# Patient Record
Sex: Male | Born: 1980 | Race: Black or African American | Hispanic: No | Marital: Single | Smoking: Light tobacco smoker
Health system: Southern US, Community
[De-identification: ages and names within clinical notes are randomized; demographics above are authoritative.]

## PROBLEM LIST (undated history)

## (undated) DIAGNOSIS — F1910 Other psychoactive substance abuse, uncomplicated: Secondary | ICD-10-CM

---

## 2019-12-27 ENCOUNTER — Emergency Department (HOSPITAL_COMMUNITY): Payer: Self-pay

## 2019-12-27 ENCOUNTER — Other Ambulatory Visit: Payer: Self-pay

## 2019-12-27 ENCOUNTER — Encounter (HOSPITAL_COMMUNITY): Payer: Self-pay | Admitting: Emergency Medicine

## 2019-12-27 ENCOUNTER — Emergency Department (HOSPITAL_COMMUNITY)
Admission: EM | Admit: 2019-12-27 | Discharge: 2019-12-27 | Disposition: A | Discharge status: Home / Self Care | Payer: Self-pay | Attending: Emergency Medicine | Admitting: Emergency Medicine

## 2019-12-27 DIAGNOSIS — T40601A Poisoning by unspecified narcotics, accidental (unintentional), initial encounter: Secondary | ICD-10-CM

## 2019-12-27 DIAGNOSIS — T402X1A Poisoning by other opioids, accidental (unintentional), initial encounter: Secondary | ICD-10-CM | POA: Insufficient documentation

## 2019-12-27 DIAGNOSIS — Z79899 Other long term (current) drug therapy: Secondary | ICD-10-CM | POA: Insufficient documentation

## 2019-12-27 DIAGNOSIS — Z20822 Contact with and (suspected) exposure to covid-19: Secondary | ICD-10-CM | POA: Insufficient documentation

## 2019-12-27 DIAGNOSIS — R110 Nausea: Secondary | ICD-10-CM | POA: Insufficient documentation

## 2019-12-27 LAB — CBC WITH DIFFERENTIAL/PLATELET
Abs Immature Granulocytes: 0.01 10*3/uL (ref 0.00–0.07)
Basophils Absolute: 0 10*3/uL (ref 0.0–0.1)
Basophils Relative: 0 %
Eosinophils Absolute: 0.2 10*3/uL (ref 0.0–0.5)
Eosinophils Relative: 3 %
HCT: 39.5 % (ref 39.0–52.0)
Hemoglobin: 13 g/dL (ref 13.0–17.0)
Immature Granulocytes: 0 %
Lymphocytes Relative: 33 %
Lymphs Abs: 1.6 10*3/uL (ref 0.7–4.0)
MCH: 29.6 pg (ref 26.0–34.0)
MCHC: 32.9 g/dL (ref 30.0–36.0)
MCV: 90 fL (ref 80.0–100.0)
Monocytes Absolute: 0.5 10*3/uL (ref 0.1–1.0)
Monocytes Relative: 10 %
Neutro Abs: 2.5 10*3/uL (ref 1.7–7.7)
Neutrophils Relative %: 54 %
Platelets: 294 10*3/uL (ref 150–400)
RBC: 4.39 MIL/uL (ref 4.22–5.81)
RDW: 12.8 % (ref 11.5–15.5)
WBC: 4.8 10*3/uL (ref 4.0–10.5)
nRBC: 0 % (ref 0.0–0.2)

## 2019-12-27 LAB — COMPREHENSIVE METABOLIC PANEL
ALT: 174 U/L — ABNORMAL HIGH (ref 0–44)
AST: 158 U/L — ABNORMAL HIGH (ref 15–41)
Albumin: 4.5 g/dL (ref 3.5–5.0)
Alkaline Phosphatase: 55 U/L (ref 38–126)
Anion gap: 9 (ref 5–15)
BUN: 14 mg/dL (ref 6–20)
CO2: 26 mmol/L (ref 22–32)
Calcium: 8.8 mg/dL — ABNORMAL LOW (ref 8.9–10.3)
Chloride: 102 mmol/L (ref 98–111)
Creatinine, Ser: 0.81 mg/dL (ref 0.61–1.24)
GFR calc Af Amer: >60 mL/min (ref >60)
GFR calc non Af Amer: >60 mL/min (ref >60)
Glucose, Bld: 157 mg/dL — ABNORMAL HIGH (ref 70–99)
Potassium: 3.7 mmol/L (ref 3.5–5.1)
Sodium: 137 mmol/L (ref 135–145)
Total Bilirubin: 0.5 mg/dL (ref 0.3–1.2)
Total Protein: 8 g/dL (ref 6.5–8.1)

## 2019-12-27 LAB — SARS CORONAVIRUS 2 BY RT PCR (HOSPITAL ORDER, PERFORMED IN ~~LOC~~ HOSPITAL LAB): SARS Coronavirus 2: NEGATIVE

## 2019-12-27 LAB — ACETAMINOPHEN LEVEL: Acetaminophen (Tylenol), Serum: <10 ug/mL — ABNORMAL LOW (ref 10–30)

## 2019-12-27 LAB — ETHANOL: Alcohol, Ethyl (B): <10 mg/dL (ref <10)

## 2019-12-27 LAB — CBG MONITORING, ED: Glucose-Capillary: 158 mg/dL — ABNORMAL HIGH (ref 70–99)

## 2019-12-27 LAB — SALICYLATE LEVEL: Salicylate Lvl: <7 mg/dL — ABNORMAL LOW (ref 7.0–30.0)

## 2019-12-27 MED ORDER — SODIUM CHLORIDE 0.9 % IV BOLUS
1000.0000 mL | Freq: Once | INTRAVENOUS | Status: AC
  Administered 2019-12-27: 1000 mL via INTRAVENOUS

## 2019-12-27 MED ORDER — SODIUM CHLORIDE 0.9 % IV SOLN: INTRAVENOUS | Status: DC

## 2019-12-27 MED ORDER — ONDANSETRON HCL 4 MG/2ML IJ SOLN
4.0000 mg | Freq: Once | INTRAMUSCULAR | Status: AC
  Administered 2019-12-27: 4 mg via INTRAVENOUS
  Filled 2019-12-27: qty 2

## 2019-12-27 MED ORDER — ONDANSETRON HCL 4 MG/2ML IJ SOLN: 4.0000 mg | Freq: Once | INTRAMUSCULAR | Status: DC

## 2019-12-27 NOTE — ED Provider Notes (Signed)
[facility]-EMERGENCY DEPT Provider Note   CSN: [id number] Arrival date & time: 12/27/19  1427     History Chief Complaint  Patient presents with  . Drug Overdose    Leib Kerth is a 39 y.o. male.  Pt presents to the ED after an overdose of opiates.  Pt said he snorted (for the first time) today.  He remembers seeing a woman and talking to her and telling her that he felt weird.  The next thing he knew, he was staring up at EMS.  Per EMS, pt had fallen onto his face and was unresponsive.  They gave him a total of 3 mg of narcan IN.  Now, he is awake and alert.  He feels a little nauseous.  He has some abrasions to his face, but he does not hurt.        History reviewed. No pertinent past medical history.  There are no problems to display for this patient.   History reviewed. No pertinent surgical history.     No family history on file.  Social History   Tobacco Use  . Smoking status: Not on file  Substance Use Topics  . Alcohol use: Not on file  . Drug use: Not on file    Home Medications Prior to Admission medications   Medication Sig Start Date End Date Taking? Authorizing Provider  Multiple Vitamin (MULTIVITAMIN ADULT) TABS Take 1 tablet by mouth daily.   Yes [provider]    Allergies    Patient has no known allergies.  Review of Systems   Review of Systems  Gastrointestinal: Positive for nausea.  All other systems reviewed and are negative.   Physical Exam Updated Vital Signs BP 119/80   Pulse (!) 56   Resp 14   SpO2 97%   Physical Exam Vitals and nursing note reviewed.  Constitutional:      Appearance: Normal appearance.  HENT:     Head: Normocephalic.     Comments: Small abrasions to forehead    Right Ear: External ear normal.     Left Ear: External ear normal.     Nose: Nose normal.     Mouth/Throat:     Mouth: Mucous membranes are dry.  Eyes:     Extraocular Movements: Extraocular movements  intact.     Conjunctiva/sclera: Conjunctivae normal.     Pupils: Pupils are equal, round, and reactive to light.  Cardiovascular:     Rate and Rhythm: Normal rate and regular rhythm.     Pulses: Normal pulses.     Heart sounds: Normal heart sounds.  Pulmonary:     Effort: Pulmonary effort is normal.     Breath sounds: Normal breath sounds.  Abdominal:     General: Abdomen is flat. Bowel sounds are normal.     Palpations: Abdomen is soft.  Musculoskeletal:        General: Normal range of motion.     Cervical back: Normal range of motion and neck supple.  Skin:    General: Skin is warm.     Capillary Refill: Capillary refill takes less than 2 seconds.  Neurological:     General: No focal deficit present.     Mental Status: He is alert and oriented to person, place, and time.  Psychiatric:        Mood and Affect: Mood normal.        Behavior: Behavior normal.        Thought Content: Thought content normal.  Judgment: Judgment normal.     ED Results / Procedures / Treatments   Labs (all labs ordered are listed, but only abnormal results are displayed) Labs Reviewed  CBG MONITORING, ED - Abnormal; Notable for the following components:      Result Value   Glucose-Capillary 158 (*)    All other components within normal limits  SARS CORONAVIRUS 2 BY RT PCR (HOSPITAL ORDER, PERFORMED IN  HOSPITAL LAB)  CBC WITH DIFFERENTIAL/PLATELET  COMPREHENSIVE METABOLIC PANEL  SALICYLATE LEVEL  ACETAMINOPHEN LEVEL  ETHANOL  RAPID URINE DRUG SCREEN, HOSP PERFORMED  URINALYSIS, ROUTINE W REFLEX MICROSCOPIC    EKG EKG Interpretation  Date/Time:  Wednesday December 27 2019 15:08:52 EDT Ventricular Rate:  58 PR Interval:    QRS Duration: 101 QT Interval:  496 QTC Calculation: 488 R Axis:   30 Text Interpretation: Sinus rhythm Abnormal R-wave progression, early transition Borderline prolonged QT interval No old tracing to compare Confirmed by Jacalyn Lefevre [phone]) on  12/27/2019 3:57:52 PM   Radiology DG Chest Portable 1 View  Result Date: 12/27/2019 CLINICAL DATA:  Unresponsive. EXAM: PORTABLE CHEST 1 VIEW COMPARISON:  None. FINDINGS: There is no evidence of acute infiltrate, pleural effusion or pneumothorax. The heart size and mediastinal contours are within normal limits. The visualized skeletal structures are unremarkable. IMPRESSION: No active disease. Electronically Signed   By: Aram Candela M.D.   On: 12/27/2019 15:21    Procedures Procedures (including critical care time)  Medications Ordered in ED Medications  sodium chloride 0.9 % bolus 1,000 mL (1,000 mLs Intravenous New Bag/Given 12/27/19 1550)    And  0.9 %  sodium chloride infusion (has no administration in time range)  ondansetron (ZOFRAN) injection 4 mg (4 mg Intravenous Given 12/27/19 1551)    ED Course  I have reviewed the triage vital signs and the nursing notes.  Pertinent labs & imaging results that were available during my care of the patient were reviewed by me and considered in my medical decision making (see chart for details).    MDM Rules/Calculators/A&P                          Pt is doing well.  Labs are pending at shift change.  Pt signed out to Dr. Deretha Emory at shift change.  Pt will need to be observed for a little longer.  If he is doing well and labs ok, he can go home.   Final Clinical Impression(s) / ED Diagnoses Final diagnoses:  Opiate overdose, accidental or unintentional, initial encounter [facility])    Rx / DC Orders ED Discharge Orders    None       Jacalyn Lefevre, MD 12/27/19 1646

## 2019-12-27 NOTE — ED Notes (Signed)
Patient ambulatory to the lobby with a steady gait and belongings. 

## 2019-12-27 NOTE — ED Triage Notes (Signed)
[place] [facility] [place]-OD on [place]. Got Narcan 3mg  intranasally at scene.  Vitals: 111/77, CBG 227, 98% on ra after Narcan given.

## 2019-12-27 NOTE — ED Provider Notes (Addendum)
Patient turned over to me following opiate overdose.  No suicidal ideation.  Patient's labs I significant abnormalities.  Patient has had some food and liquids to drink.  But is now having some difficulty with nausea.  We will give him Zofran.  His EKG just had borderline prolonged QT.  So I think it will be safe.  He has the IV in place and the more monitor to him.  He does okay will be discharged home.   Vanetta Mulders, MD 12/27/19 2012  Patient feeling better after the Zofran.  Cardiac monitoring shows notes prolonged or extension of the QT interval.  Patient stable for discharge home.   Vanetta Mulders, MD 12/27/19 2059

## 2019-12-27 NOTE — ED Notes (Signed)
Assumed care of patient at this time, nad noted, sr up x2, bed locked and low, call bell w/I reach.  Will continue to monitor.  sandwich and juice has been provided.

## 2019-12-27 NOTE — Discharge Instructions (Addendum)
Return for any new or worse symptoms.  Information provided on substance abuse.

## 2020-06-25 ENCOUNTER — Emergency Department (HOSPITAL_COMMUNITY)
Admission: EM | Admit: 2020-06-25 | Discharge: 2020-06-25 | Disposition: A | Discharge status: Home / Self Care | Payer: Self-pay | Attending: Emergency Medicine | Admitting: Emergency Medicine

## 2020-06-25 ENCOUNTER — Emergency Department (HOSPITAL_COMMUNITY): Payer: Self-pay

## 2020-06-25 ENCOUNTER — Other Ambulatory Visit: Payer: Self-pay

## 2020-06-25 DIAGNOSIS — R0789 Other chest pain: Secondary | ICD-10-CM

## 2020-06-25 DIAGNOSIS — R1310 Dysphagia, unspecified: Secondary | ICD-10-CM | POA: Insufficient documentation

## 2020-06-25 MED ORDER — ALUM & MAG HYDROXIDE-SIMETH 200-200-20 MG/5ML PO SUSP
30.0000 mL | Freq: Once | ORAL | Status: AC
  Administered 2020-06-25: 30 mL via ORAL
  Filled 2020-06-25: qty 30

## 2020-06-25 MED ORDER — LIDOCAINE VISCOUS HCL 2 % MT SOLN
15.0000 mL | Freq: Once | OROMUCOSAL | Status: AC
  Administered 2020-06-25: 15 mL via ORAL
  Filled 2020-06-25: qty 15
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
## 2020-06-25 NOTE — Discharge Instructions (Signed)
You were seen today for some chest discomfort.  This related to chest wall pain.  Your x-ray and EKG are reassuring.  Take ibuprofen as needed for pain.

## 2020-06-25 NOTE — ED Notes (Signed)
Pt taken to scan at this time. 

## 2020-06-26 ENCOUNTER — Other Ambulatory Visit: Payer: Self-pay

## 2020-06-26 ENCOUNTER — Encounter (HOSPITAL_COMMUNITY): Payer: Self-pay

## 2020-06-26 ENCOUNTER — Emergency Department (HOSPITAL_COMMUNITY)
Admission: EM | Admit: 2020-06-26 | Discharge: 2020-06-26 | Disposition: A | Discharge status: Home / Self Care | Payer: Self-pay | Attending: Emergency Medicine | Admitting: Emergency Medicine

## 2020-06-26 DIAGNOSIS — R432 Parageusia: Secondary | ICD-10-CM

## 2020-06-26 DIAGNOSIS — R439 Unspecified disturbances of smell and taste: Secondary | ICD-10-CM | POA: Insufficient documentation

## 2020-06-26 LAB — COMPREHENSIVE METABOLIC PANEL
ALT: 77 U/L — ABNORMAL HIGH (ref 0–44)
AST: 72 U/L — ABNORMAL HIGH (ref 15–41)
Albumin: 4.2 g/dL (ref 3.5–5.0)
Alkaline Phosphatase: 57 U/L (ref 38–126)
Anion gap: 8 (ref 5–15)
BUN: 14 mg/dL (ref 6–20)
CO2: 28 mmol/L (ref 22–32)
Calcium: 9.2 mg/dL (ref 8.9–10.3)
Chloride: 99 mmol/L (ref 98–111)
Creatinine, Ser: 0.88 mg/dL (ref 0.61–1.24)
GFR, Estimated: >60 mL/min (ref >60)
Glucose, Bld: 96 mg/dL (ref 70–99)
Potassium: 4.2 mmol/L (ref 3.5–5.1)
Sodium: 135 mmol/L (ref 135–145)
Total Bilirubin: 0.8 mg/dL (ref 0.3–1.2)
Total Protein: 7.6 g/dL (ref 6.5–8.1)

## 2020-06-26 LAB — CBC
HCT: 37.4 % — ABNORMAL LOW (ref 39.0–52.0)
Hemoglobin: 12.3 g/dL — ABNORMAL LOW (ref 13.0–17.0)
MCH: 29.1 pg (ref 26.0–34.0)
MCHC: 32.9 g/dL (ref 30.0–36.0)
MCV: 88.4 fL (ref 80.0–100.0)
Platelets: 385 10*3/uL (ref 150–400)
RBC: 4.23 MIL/uL (ref 4.22–5.81)
RDW: 11.8 % (ref 11.5–15.5)
WBC: 5 10*3/uL (ref 4.0–10.5)
nRBC: 0 % (ref 0.0–0.2)

## 2020-06-26 MED ORDER — LIDOCAINE VISCOUS HCL 2 % MT SOLN
15.0000 mL | Freq: Once | OROMUCOSAL | Status: AC
  Administered 2020-06-26: 15 mL via ORAL
  Filled 2020-06-26: qty 15

## 2020-06-26 MED ORDER — ALUM & MAG HYDROXIDE-SIMETH 200-200-20 MG/5ML PO SUSP
30.0000 mL | Freq: Once | ORAL | Status: AC
  Administered 2020-06-26: 30 mL via ORAL
  Filled 2020-06-26: qty 30

## 2020-06-26 MED ORDER — ALUM & MAG HYDROXIDE-SIMETH 400-400-40 MG/5ML PO SUSP: 5.0000 mL | ORAL | 0 refills | Status: DC | PRN

## 2020-06-26 MED ORDER — FAMOTIDINE 20 MG PO TABS
20.0000 mg | ORAL_TABLET | Freq: Once | ORAL | Status: AC
  Administered 2020-06-26: 20 mg via ORAL
  Filled 2020-06-26: qty 1

## 2020-06-26 MED ORDER — FAMOTIDINE 20 MG PO TABS: 20.0000 mg | ORAL_TABLET | Freq: Every day | ORAL | 0 refills | Status: DC

## 2020-06-26 NOTE — Discharge Instructions (Addendum)
Take pepcid daily to decrease stomach acid.  Use maalox as needed for breakthrough symptoms.  Follow up with the stomach doctor listed below as needed for further evaluation.  Return to there ER with any new, worsening, or concerning symptoms.
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
## 2020-06-26 NOTE — ED Triage Notes (Signed)
Pt BIB [facility] from home c/o "tasting blood in his mouth". Thinks it's coming from somewhere lower, no visible blood. No visible trauma. VSS. Pt reports anxiety, seen at [facility] yesterday for 'unspecified chest pain', prescribed tylenol.  BP 134 HR 80 SpO2 98% RA

## 2020-06-29 ENCOUNTER — Other Ambulatory Visit: Payer: Self-pay

## 2020-06-29 ENCOUNTER — Emergency Department (HOSPITAL_COMMUNITY)
Admission: EM | Admit: 2020-06-29 | Discharge: 2020-06-29 | Disposition: A | Discharge status: Home / Self Care | Payer: Self-pay | Attending: Emergency Medicine | Admitting: Emergency Medicine

## 2020-06-29 DIAGNOSIS — F1910 Other psychoactive substance abuse, uncomplicated: Secondary | ICD-10-CM | POA: Insufficient documentation

## 2020-06-29 DIAGNOSIS — F1010 Alcohol abuse, uncomplicated: Secondary | ICD-10-CM | POA: Insufficient documentation

## 2020-06-29 DIAGNOSIS — F419 Anxiety disorder, unspecified: Secondary | ICD-10-CM | POA: Insufficient documentation

## 2020-06-29 NOTE — ED Notes (Signed)
Agree with triage note. Vitals taken. A&Ox4. Respirations regular/unlabored. Pt able to speak full sentences. Connected to cardiac monitor, BP, pulse ox. Stretcher low, wheels locked, call bell within reach.

## 2020-06-29 NOTE — ED Notes (Signed)
Pt given discharge paper work. Vitals taken. Pt changing into clothes for discharge.

## 2020-06-29 NOTE — ED Provider Notes (Signed)
[facility]-EMERGENCY DEPT Provider Note   CSN: [id number] Arrival date & time: 06/29/20  0235     History Chief Complaint  Patient presents with  . Drug Problem  . Anxiety    Robert Duffy is a 40 y.o. male.  Patient presents to the emergency department with concerns over feeling very anxious after doing multiple drugs and drinking alcohol.  He is not homicidal or suicidal.        No past medical history on file.  There are no problems to display for this patient.   No past surgical history on file.     No family history on file.     Home Medications Prior to Admission medications   Medication Sig Start Date End Date Taking? Authorizing Provider  alum & mag hydroxide-simeth (MAALOX ADVANCED MAX ST) 400-400-40 MG/5ML suspension Take 5 mLs by mouth as needed for indigestion. 06/26/20   Caccavale, Sophia, PA-C  famotidine (PEPCID) 20 MG tablet Take 1 tablet (20 mg total) by mouth daily. 06/26/20   Caccavale, Sophia, PA-C  Multiple Vitamin (MULTIVITAMIN ADULT) TABS Take 1 tablet by mouth daily.    [provider]    Allergies    Patient has no known allergies.  Review of Systems   Review of Systems  Psychiatric/Behavioral: The patient is nervous/anxious.   All other systems reviewed and are negative.   Physical Exam Updated Vital Signs BP (!) 150/96   Pulse 70   Temp 98.1 F (36.7 C) (Oral)   Resp 13   Ht 5\' 11"  (1.803 m)   Wt 83.9 kg   SpO2 96%   BMI 25.80 kg/m   Physical Exam Vitals and nursing note reviewed.  Constitutional:      General: He is not in acute distress.    Appearance: Normal appearance. He is well-developed and well-nourished.  HENT:     Head: Normocephalic and atraumatic.     Right Ear: Hearing normal.     Left Ear: Hearing normal.     Nose: Nose normal.     Mouth/Throat:     Mouth: Oropharynx is clear and moist and mucous membranes are normal.  Eyes:     Extraocular Movements: EOM normal.      Conjunctiva/sclera: Conjunctivae normal.     Pupils: Pupils are equal, round, and reactive to light.  Cardiovascular:     Rate and Rhythm: Regular rhythm.     Heart sounds: S1 normal and S2 normal. No murmur heard. No friction rub. No gallop.   Pulmonary:     Effort: Pulmonary effort is normal. No respiratory distress.     Breath sounds: Normal breath sounds.  Chest:     Chest wall: No tenderness.  Abdominal:     General: Bowel sounds are normal.     Palpations: Abdomen is soft. There is no hepatosplenomegaly.     Tenderness: There is no abdominal tenderness. There is no guarding or rebound. Negative signs include Murphy's sign and McBurney's sign.     Hernia: No hernia is present.  Musculoskeletal:        General: Normal range of motion.     Cervical back: Normal range of motion and neck supple.  Skin:    General: Skin is warm, dry and intact.     Findings: No rash.     Nails: There is no cyanosis.  Neurological:     Mental Status: He is alert and oriented to person, place, and time.     GCS: GCS  eye subscore is 4. GCS verbal subscore is 5. GCS motor subscore is 6.     Cranial Nerves: No cranial nerve deficit.     Sensory: No sensory deficit.     Coordination: Coordination normal.     Deep Tendon Reflexes: Strength normal.  Psychiatric:        Mood and Affect: Mood and affect normal.        Speech: Speech normal.        Behavior: Behavior normal.        Thought Content: Thought content normal.     ED Results / Procedures / Treatments   Labs (all labs ordered are listed, but only abnormal results are displayed) Labs Reviewed - No data to display  EKG None  Radiology No results found.  Procedures Procedures   Medications Ordered in ED Medications - No data to display  ED Course  I have reviewed the triage vital signs and the nursing notes.  Pertinent labs & imaging results that were available during my care of the patient were reviewed by me and considered in  my medical decision making (see chart for details).    MDM Rules/Calculators/A&P                          Patient comes to the emergency department for evaluation of anxiety.  Patient has been drinking alcohol, using marijuana and ecstasy tonight.  Patient's anxiety is secondary to substance use disorder.  He is relatively calm at this point with essentially normal vital signs.  He is not homicidal or suicidal.  Has been monitored through the night, discharged with substance abuse resources.  Final Clinical Impression(s) / ED Diagnoses Final diagnoses:  Anxiety  Polysubstance abuse [facility])    Rx / DC Orders ED Discharge Orders    None       Taviana Westergren, Canary Brim, MD 06/29/20 [phone]
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
## 2020-07-18 ENCOUNTER — Emergency Department (HOSPITAL_COMMUNITY)
Admission: EM | Admit: 2020-07-18 | Discharge: 2020-07-18 | Disposition: A | Discharge status: Home / Self Care | Payer: Self-pay | Attending: Emergency Medicine | Admitting: Emergency Medicine

## 2020-07-18 ENCOUNTER — Emergency Department (HOSPITAL_COMMUNITY): Payer: Self-pay

## 2020-07-18 ENCOUNTER — Other Ambulatory Visit: Payer: Self-pay

## 2020-07-18 ENCOUNTER — Encounter (HOSPITAL_COMMUNITY): Payer: Self-pay | Admitting: Emergency Medicine

## 2020-07-18 DIAGNOSIS — R0602 Shortness of breath: Secondary | ICD-10-CM | POA: Insufficient documentation

## 2020-07-18 DIAGNOSIS — R1310 Dysphagia, unspecified: Secondary | ICD-10-CM | POA: Insufficient documentation

## 2020-07-18 MED ORDER — FAMOTIDINE 20 MG PO TABS: 20.0000 mg | ORAL_TABLET | Freq: Two times a day (BID) | ORAL | 0 refills | Status: AC

## 2020-07-18 MED ORDER — FAMOTIDINE 20 MG PO TABS
20.0000 mg | ORAL_TABLET | Freq: Once | ORAL | Status: AC
  Administered 2020-07-18: 20 mg via ORAL
  Filled 2020-07-18: qty 1

## 2020-07-18 MED ORDER — LIDOCAINE VISCOUS HCL 2 % MT SOLN
15.0000 mL | Freq: Once | OROMUCOSAL | Status: AC
  Administered 2020-07-18: 15 mL via ORAL
  Filled 2020-07-18: qty 15

## 2020-07-18 MED ORDER — ALUM & MAG HYDROXIDE-SIMETH 200-200-20 MG/5ML PO SUSP
30.0000 mL | Freq: Once | ORAL | Status: AC
  Administered 2020-07-18: 30 mL via ORAL
  Filled 2020-07-18: qty 30
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
## 2020-07-18 NOTE — Discharge Instructions (Signed)
Please read and follow all provided instructions.  Your diagnoses today include:  1. Shortness of breath     Tests performed today include:  Chest x-ray - no problems seen  Vital signs. See below for your results today.   Medications prescribed:   Pepcid (famotidine) - antihistamine  You can find this medication over-the-counter.   DO NOT exceed:   20mg  Pepcid every 12 hours  Take any prescribed medications only as directed.  Home care instructions:  Follow any educational materials contained in this packet.  BE VERY CAREFUL not to take multiple medicines containing Tylenol (also called acetaminophen). Doing so can lead to an overdose which can damage your liver and cause liver failure and possibly death.   Follow-up instructions: Please follow-up with your primary care provider in the next 7 days for further evaluation of your symptoms.   Return instructions:   Please return to the Emergency Department if you experience worsening symptoms.   Please return if you have any other emergent concerns.  Additional Information:  Your vital signs today were: BP 117/64 (BP Location: Left Arm)   Pulse 74   Temp (!) 97.4 F (36.3 C) (Oral)   Resp 18   SpO2 96%  If your blood pressure (BP) was elevated above 135/85 this visit, please have this repeated by your doctor within one month. --------------
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
## 2020-07-24 ENCOUNTER — Emergency Department (HOSPITAL_COMMUNITY): Payer: Self-pay

## 2020-07-24 ENCOUNTER — Emergency Department (HOSPITAL_COMMUNITY)
Admission: EM | Admit: 2020-07-24 | Discharge: 2020-07-24 | Disposition: A | Discharge status: Home / Self Care | Payer: Self-pay | Attending: Emergency Medicine | Admitting: Emergency Medicine

## 2020-07-24 ENCOUNTER — Encounter (HOSPITAL_COMMUNITY): Payer: Self-pay

## 2020-07-24 ENCOUNTER — Other Ambulatory Visit: Payer: Self-pay

## 2020-07-24 DIAGNOSIS — R0602 Shortness of breath: Secondary | ICD-10-CM | POA: Insufficient documentation

## 2020-07-24 DIAGNOSIS — J029 Acute pharyngitis, unspecified: Secondary | ICD-10-CM | POA: Insufficient documentation

## 2020-07-24 DIAGNOSIS — R0789 Other chest pain: Secondary | ICD-10-CM | POA: Insufficient documentation

## 2020-07-24 DIAGNOSIS — F17210 Nicotine dependence, cigarettes, uncomplicated: Secondary | ICD-10-CM | POA: Insufficient documentation

## 2020-07-24 MED ORDER — LIDOCAINE VISCOUS HCL 2 % MT SOLN
15.0000 mL | Freq: Once | OROMUCOSAL | Status: AC
  Administered 2020-07-24: 15 mL via ORAL
  Filled 2020-07-24: qty 15

## 2020-07-24 MED ORDER — ALUM & MAG HYDROXIDE-SIMETH 200-200-20 MG/5ML PO SUSP
30.0000 mL | Freq: Once | ORAL | Status: AC
  Administered 2020-07-24: 30 mL via ORAL
  Filled 2020-07-24: qty 30

## 2020-07-24 NOTE — ED Notes (Signed)
Provided pt with sandwich and sprite on discharge.

## 2020-07-24 NOTE — ED Triage Notes (Signed)
Pt BIB [facility] with his esophagus moving while walking. Pt states he fell 2 weeks ago and this problem has occurred since then. No difficulty breathing, SHOB, or chest pain. Pt is ambulatory, A&Ox4.
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
## 2020-07-24 NOTE — Discharge Instructions (Addendum)
You were provided with the x-ray of your chest.  Please fill your medications were prescribed to you on your last visit.  To the [facility] is attached to your chart, please schedule an appointment for further primary care.

## 2020-07-24 NOTE — ED Triage Notes (Signed)
Per EMS-states his "esophagus" moves when he walks

## 2020-07-31 ENCOUNTER — Other Ambulatory Visit: Payer: Self-pay

## 2020-07-31 ENCOUNTER — Emergency Department (HOSPITAL_COMMUNITY)
Admission: EM | Admit: 2020-07-31 | Discharge: 2020-07-31 | Disposition: A | Discharge status: Home / Self Care | Payer: Self-pay | Attending: Emergency Medicine | Admitting: Emergency Medicine

## 2020-07-31 ENCOUNTER — Encounter (HOSPITAL_COMMUNITY): Payer: Self-pay

## 2020-07-31 ENCOUNTER — Emergency Department (HOSPITAL_COMMUNITY): Payer: Self-pay

## 2020-07-31 DIAGNOSIS — R140 Abdominal distension (gaseous): Secondary | ICD-10-CM | POA: Insufficient documentation

## 2020-07-31 DIAGNOSIS — K3189 Other diseases of stomach and duodenum: Secondary | ICD-10-CM

## 2020-07-31 DIAGNOSIS — R0789 Other chest pain: Secondary | ICD-10-CM | POA: Insufficient documentation

## 2020-07-31 DIAGNOSIS — F17210 Nicotine dependence, cigarettes, uncomplicated: Secondary | ICD-10-CM | POA: Insufficient documentation

## 2020-07-31 LAB — TROPONIN I (HIGH SENSITIVITY): Troponin I (High Sensitivity): 2 ng/L (ref <18)

## 2020-07-31 MED ORDER — OMEPRAZOLE 20 MG PO CPDR: 20.0000 mg | DELAYED_RELEASE_CAPSULE | Freq: Every day | ORAL | 0 refills | Status: DC

## 2020-07-31 MED ORDER — ALUM & MAG HYDROXIDE-SIMETH 200-200-20 MG/5ML PO SUSP
30.0000 mL | Freq: Once | ORAL | Status: AC
  Administered 2020-07-31: 30 mL via ORAL
  Filled 2020-07-31: qty 30
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
## 2020-07-31 NOTE — ED Notes (Signed)
Pt to X-Ray via stretcher 

## 2020-07-31 NOTE — Discharge Instructions (Signed)
You were seen today for atypical chest pain.  This is likely related to your stomach.  You may have reflux and should start medications.  Your x-ray shows that your stomach is very distended.  Given that you are not vomiting, this is likely related to the large amount of food that you ate prior to arrival.  Make sure that you are eating small meals.
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
## 2020-08-20 ENCOUNTER — Encounter (HOSPITAL_COMMUNITY): Payer: Self-pay

## 2020-08-20 ENCOUNTER — Other Ambulatory Visit: Payer: Self-pay

## 2020-08-20 ENCOUNTER — Emergency Department (HOSPITAL_COMMUNITY)
Admission: EM | Admit: 2020-08-20 | Discharge: 2020-08-20 | Disposition: A | Discharge status: Home / Self Care | Payer: Self-pay | Attending: Emergency Medicine | Admitting: Emergency Medicine

## 2020-08-20 DIAGNOSIS — R0789 Other chest pain: Secondary | ICD-10-CM | POA: Insufficient documentation

## 2020-08-20 DIAGNOSIS — R42 Dizziness and giddiness: Secondary | ICD-10-CM | POA: Insufficient documentation

## 2020-08-20 DIAGNOSIS — F17210 Nicotine dependence, cigarettes, uncomplicated: Secondary | ICD-10-CM | POA: Insufficient documentation

## 2020-08-20 LAB — COMPREHENSIVE METABOLIC PANEL
ALT: 80 U/L — ABNORMAL HIGH (ref 0–44)
AST: 82 U/L — ABNORMAL HIGH (ref 15–41)
Albumin: 4.1 g/dL (ref 3.5–5.0)
Alkaline Phosphatase: 54 U/L (ref 38–126)
Anion gap: 7 (ref 5–15)
BUN: 16 mg/dL (ref 6–20)
CO2: 27 mmol/L (ref 22–32)
Calcium: 8.7 mg/dL — ABNORMAL LOW (ref 8.9–10.3)
Chloride: 100 mmol/L (ref 98–111)
Creatinine, Ser: 0.89 mg/dL (ref 0.61–1.24)
GFR, Estimated: >60 mL/min (ref >60)
Glucose, Bld: 93 mg/dL (ref 70–99)
Potassium: 3.9 mmol/L (ref 3.5–5.1)
Sodium: 134 mmol/L — ABNORMAL LOW (ref 135–145)
Total Bilirubin: 0.5 mg/dL (ref 0.3–1.2)
Total Protein: 7.4 g/dL (ref 6.5–8.1)

## 2020-08-20 LAB — CBC WITH DIFFERENTIAL/PLATELET
Abs Immature Granulocytes: 0 10*3/uL (ref 0.00–0.07)
Basophils Absolute: 0 10*3/uL (ref 0.0–0.1)
Basophils Relative: 0 %
Eosinophils Absolute: 0.2 10*3/uL (ref 0.0–0.5)
Eosinophils Relative: 5 %
HCT: 37 % — ABNORMAL LOW (ref 39.0–52.0)
Hemoglobin: 12.2 g/dL — ABNORMAL LOW (ref 13.0–17.0)
Immature Granulocytes: 0 %
Lymphocytes Relative: 34 %
Lymphs Abs: 1.6 10*3/uL (ref 0.7–4.0)
MCH: 29 pg (ref 26.0–34.0)
MCHC: 33 g/dL (ref 30.0–36.0)
MCV: 88.1 fL (ref 80.0–100.0)
Monocytes Absolute: 0.4 10*3/uL (ref 0.1–1.0)
Monocytes Relative: 10 %
Neutro Abs: 2.3 10*3/uL (ref 1.7–7.7)
Neutrophils Relative %: 51 %
Platelets: 307 10*3/uL (ref 150–400)
RBC: 4.2 MIL/uL — ABNORMAL LOW (ref 4.22–5.81)
RDW: 12.2 % (ref 11.5–15.5)
WBC: 4.6 10*3/uL (ref 4.0–10.5)
nRBC: 0 % (ref 0.0–0.2)

## 2020-08-20 LAB — LIPASE, BLOOD: Lipase: 25 U/L (ref 11–51)

## 2020-08-20 LAB — ETHANOL: Alcohol, Ethyl (B): <10 mg/dL (ref <10)

## 2020-08-20 LAB — TROPONIN I (HIGH SENSITIVITY): Troponin I (High Sensitivity): 2 ng/L (ref <18)

## 2020-08-20 MED ORDER — LIDOCAINE VISCOUS HCL 2 % MT SOLN
15.0000 mL | Freq: Once | OROMUCOSAL | Status: AC
  Administered 2020-08-20: 15 mL via ORAL
  Filled 2020-08-20: qty 15

## 2020-08-20 MED ORDER — ALUM & MAG HYDROXIDE-SIMETH 200-200-20 MG/5ML PO SUSP
30.0000 mL | Freq: Once | ORAL | Status: AC
  Administered 2020-08-20: 30 mL via ORAL
  Filled 2020-08-20: qty 30

## 2020-08-20 NOTE — ED Notes (Signed)
Per Donnald Garre

## 2020-08-20 NOTE — ED Triage Notes (Addendum)
Pt c/o chest pain starting yesterday. He reports dizziness when he moves upper limbs above his head.

## 2020-08-20 NOTE — Discharge Instructions (Signed)
1.  Make an appointment to see a family doctor soon as possible.  You have been given information for [facility] and wellness.

## 2020-08-20 NOTE — ED Notes (Signed)
Pt provided with urinal and sample requested 

## 2020-08-20 NOTE — ED Provider Notes (Signed)
[facility]-EMERGENCY DEPT Provider Note   CSN: [id number] Arrival date & time: 08/20/20  7482     History Chief Complaint  Patient presents with  . Chest Pain    Robert Duffy is a 40 y.o. male.  HPI Patient is giving a wandering history, is difficult to ascertain patient's chief complaint.  He mentions dizziness but then is attributing that to the GI cocktail that he just had.  He describes having adjusted his pillow during the night and then experiencing a sensation of things moving in his central and upper abdomen.  He reports he then became concerned that something had possibly ruptured or shifted, may be causing something (blood?)  To leak out and make him lightheaded.  At this time, he is denying that he is in any pain.  He admits that his focus is somewhat wandering and hard to focus on one particular thing.    History reviewed. No pertinent past medical history.  There are no problems to display for this patient.   History reviewed. No pertinent surgical history.     History reviewed. No pertinent family history.  Social History   Tobacco Use  . Smoking status: Light Tobacco Smoker    Types: Cigarettes  . Smokeless tobacco: Never Used  Vaping Use  . Vaping Use: Never used  Substance Use Topics  . Alcohol use: Yes  . Drug use: Yes    Types: Marijuana    Home Medications Prior to Admission medications   Medication Sig Start Date End Date Taking? Authorizing Provider  famotidine (PEPCID) 20 MG tablet Take 1 tablet (20 mg total) by mouth 2 (two) times daily. 07/18/20   Renne Crigler, PA-C  omeprazole (PRILOSEC) 20 MG capsule Take 1 capsule (20 mg total) by mouth daily. 07/31/20   Horton, Mayer Masker, MD    Allergies    Patient has no known allergies.  Review of Systems   Review of Systems 10 systems reviewed negative except as per HPI, patient seems to be unreliable historian Physical Exam Updated Vital Signs BP 109/86   Pulse 63    Temp 98.2 F (36.8 C) (Oral)   Resp 13   SpO2 98%   Physical Exam Constitutional:      Comments: Patient is nontoxic and well in appearance.  Well-nourished well-developed.  Normal vital signs on the monitor.  No respiratory distress.  HENT:     Head: Normocephalic and atraumatic.     Nose: Nose normal.     Mouth/Throat:     Mouth: Mucous membranes are moist.     Pharynx: Oropharynx is clear.  Eyes:     Extraocular Movements: Extraocular movements intact.     Pupils: Pupils are equal, round, and reactive to light.  Cardiovascular:     Rate and Rhythm: Normal rate and regular rhythm.     Heart sounds: Normal heart sounds.  Pulmonary:     Effort: Pulmonary effort is normal.     Breath sounds: Normal breath sounds.  Abdominal:     General: There is no distension.     Palpations: Abdomen is soft.     Tenderness: There is no abdominal tenderness. There is no guarding.  Musculoskeletal:        General: No swelling or tenderness. Normal range of motion.     Cervical back: Neck supple.     Right lower leg: No edema.     Left lower leg: No edema.  Skin:    General: Skin is warm  and dry.  Neurological:     Comments: No focal neurologic deficits.  Patient's level of alertness and cognitive expression at this time suggest impairment of some etiology.  No focal motor deficits.  Patient follows commands.  Words are well-formed but content of speech is rambling and tangential.     ED Results / Procedures / Treatments   Labs (all labs ordered are listed, but only abnormal results are displayed) Labs Reviewed  COMPREHENSIVE METABOLIC PANEL - Abnormal; Notable for the following components:      Result Value   Sodium 134 (*)    Calcium 8.7 (*)    AST 82 (*)    ALT 80 (*)    All other components within normal limits  CBC WITH DIFFERENTIAL/PLATELET - Abnormal; Notable for the following components:   RBC 4.20 (*)    Hemoglobin 12.2 (*)    HCT 37.0 (*)    All other components within  normal limits  ETHANOL  LIPASE, BLOOD  URINALYSIS, ROUTINE W REFLEX MICROSCOPIC  RAPID URINE DRUG SCREEN, HOSP PERFORMED  TROPONIN I (HIGH SENSITIVITY)    EKG EKG Interpretation  Date/Time:  Tuesday August 20 2020 06:41:40 EDT Ventricular Rate:  68 PR Interval:  138 QRS Duration: 93 QT Interval:  432 QTC Calculation: 460 R Axis:   37 Text Interpretation: Sinus rhythm no change from old Confirmed by Arby Barrette [phone]) on 08/20/2020 7:06:25 AM   Radiology No results found.  Procedures Procedures   Medications Ordered in ED Medications  alum & mag hydroxide-simeth (MAALOX/MYLANTA) 200-200-20 MG/5ML suspension 30 mL (30 mLs Oral Given 08/20/20 0658)    And  lidocaine (XYLOCAINE) 2 % viscous mouth solution 15 mL (15 mLs Oral Given 08/20/20 4193)    ED Course  I have reviewed the triage vital signs and the nursing notes.  Pertinent labs & imaging results that were available during my care of the patient were reviewed by me and considered in my medical decision making (see chart for details).    MDM Rules/Calculators/A&P                         Patient has nonspecific complaints.  He several times references a perception of things moving around in his abdomen.  Patient does not have any abdominal pain.  Abdominal exam is nontender.  Very low suspicion for intra-abdominal surgical condition.  Patient has a mild LFT elevation which is less than 7 months ago.  EMR indicates alcohol use history but patient is not acutely intoxicated.  Patient does not have any significant reproducible right upper quadrant pain or epigastric pain to suggest biliary colic or gallstone pancreatitis.  Review of EMR indicates that this has been a ongoing sensory perception.  EKG is unchanged from previous, troponin negative.  Patient's vital signs remained stable.  This time recommendation is for continued evaluation with a family physician.  Information provided for follow-up with [facility]  and wellness.  Final Clinical Impression(s) / ED Diagnoses Final diagnoses:  Atypical chest pain  Lightheadedness    Rx / DC Orders ED Discharge Orders    None       Arby Barrette, MD 08/20/20 1120

## 2021-04-01 ENCOUNTER — Emergency Department (HOSPITAL_COMMUNITY): Payer: Medicaid Other

## 2021-04-01 ENCOUNTER — Encounter (HOSPITAL_COMMUNITY): Payer: Self-pay

## 2021-04-01 ENCOUNTER — Other Ambulatory Visit: Payer: Self-pay

## 2021-04-01 ENCOUNTER — Emergency Department (HOSPITAL_COMMUNITY)
Admission: EM | Admit: 2021-04-01 | Discharge: 2021-04-01 | Disposition: A | Discharge status: Home / Self Care | Payer: Medicaid Other | Attending: Emergency Medicine | Admitting: Emergency Medicine

## 2021-04-01 DIAGNOSIS — S0269XA Fracture of mandible of other specified site, initial encounter for closed fracture: Secondary | ICD-10-CM | POA: Insufficient documentation

## 2021-04-01 DIAGNOSIS — W07XXXA Fall from chair, initial encounter: Secondary | ICD-10-CM | POA: Insufficient documentation

## 2021-04-01 DIAGNOSIS — F17210 Nicotine dependence, cigarettes, uncomplicated: Secondary | ICD-10-CM | POA: Insufficient documentation

## 2021-04-01 DIAGNOSIS — S0292XA Unspecified fracture of facial bones, initial encounter for closed fracture: Secondary | ICD-10-CM

## 2021-04-01 DIAGNOSIS — K0889 Other specified disorders of teeth and supporting structures: Secondary | ICD-10-CM | POA: Insufficient documentation

## 2021-04-01 MED ORDER — MORPHINE SULFATE 15 MG PO TABS
7.5000 mg | ORAL_TABLET | ORAL | 0 refills | Status: DC | PRN
Start: 2021-04-01 — End: 2022-09-05

## 2021-04-01 MED ORDER — OXYCODONE HCL 5 MG PO TABS
5.0000 mg | ORAL_TABLET | Freq: Once | ORAL | Status: AC
  Administered 2021-04-01: 5 mg via ORAL

## 2021-04-01 MED ORDER — ACETAMINOPHEN 500 MG PO TABS
1000.0000 mg | ORAL_TABLET | Freq: Once | ORAL | Status: AC
  Administered 2021-04-01: 1000 mg via ORAL

## 2021-04-01 NOTE — ED Provider Notes (Signed)
[facility] Provider Note   CSN: [id number] Arrival date & time: 04/01/21  8182     History Chief Complaint  Patient presents with   Jaw Pain   Dental Pain    Robert Duffy is a 40 y.o. male.  40 yo M with a chief complaint of a fall.  This happened 3 days ago.  The patient was getting out of the barber's chair and he lost his balance and fell and landed on his chin.  Has had some significant pain since then but this morning he yawned and felt like something shifted in there and now has trouble fully opening his mouth.  He denies any other injury in the fall.  Denies confusion denies vomiting denies arm or leg pain denies chest pain abdominal pain.  The history is provided by the patient.  Dental Pain Associated symptoms: no congestion, no facial swelling, no fever and no headaches   Injury This is a new problem. The current episode started 2 days ago. The problem occurs constantly. The problem has not changed since onset.Pertinent negatives include no chest pain, no abdominal pain, no headaches and no shortness of breath. Exacerbated by: chewing. Nothing relieves the symptoms. He has tried nothing for the symptoms. The treatment provided no relief.      History reviewed. No pertinent past medical history.  There are no problems to display for this patient.   History reviewed. No pertinent surgical history.     No family history on file.  Social History   Tobacco Use   Smoking status: Light Smoker    Types: Cigarettes   Smokeless tobacco: Never  Vaping Use   Vaping Use: Never used  Substance Use Topics   Alcohol use: Yes   Drug use: Yes    Types: Marijuana, Cocaine    Home Medications Prior to Admission medications   Medication Sig Start Date End Date Taking? Authorizing Provider  morphine (MSIR) 15 MG tablet Take 0.5 tablets (7.5 mg total) by mouth every 4 (four) hours as needed for severe pain. 04/01/21  Yes Melene Plan, DO   famotidine (PEPCID) 20 MG tablet Take 1 tablet (20 mg total) by mouth 2 (two) times daily. 07/18/20   Renne Crigler, PA-C  omeprazole (PRILOSEC) 20 MG capsule Take 1 capsule (20 mg total) by mouth daily. 07/31/20   Horton, Mayer Masker, MD    Allergies    Patient has no known allergies.  Review of Systems   Review of Systems  Constitutional:  Negative for chills and fever.  HENT:  Positive for dental problem. Negative for congestion and facial swelling.   Eyes:  Negative for discharge and visual disturbance.  Respiratory:  Negative for shortness of breath.   Cardiovascular:  Negative for chest pain and palpitations.  Gastrointestinal:  Negative for abdominal pain, diarrhea and vomiting.  Musculoskeletal:  Negative for arthralgias and myalgias.  Skin:  Negative for color change and rash.  Neurological:  Negative for tremors, syncope and headaches.  Psychiatric/Behavioral:  Negative for confusion and dysphoric mood.    Physical Exam Updated Vital Signs BP (!) 130/93 (BP Location: Left Arm)   Pulse 70   Temp 98.6 F (37 C) (Oral)   Resp 17   Wt 83 kg   SpO2 99%   BMI 25.52 kg/m   Physical Exam Vitals and nursing note reviewed.  Constitutional:      Appearance: He is well-developed.  HENT:     Head: Normocephalic and atraumatic.  Mouth/Throat:     Comments: Pain to the mentum of the mandible.  No obvious deformity.  Limited ability to open his jaw.  No pain at the angle of the jaw.  No pain at the TMJ.  No obvious dental injury.  Poor dentition diffusely. Eyes:     Pupils: Pupils are equal, round, and reactive to light.  Neck:     Vascular: No JVD.  Cardiovascular:     Rate and Rhythm: Normal rate and regular rhythm.     Heart sounds: No murmur heard.   No friction rub. No gallop.  Pulmonary:     Effort: No respiratory distress.     Breath sounds: No wheezing.  Abdominal:     General: There is no distension.     Tenderness: There is no abdominal tenderness. There is  no guarding or rebound.  Musculoskeletal:        General: Normal range of motion.     Cervical back: Normal range of motion and neck supple.  Skin:    Coloration: Skin is not pale.     Findings: No rash.  Neurological:     Mental Status: He is alert and oriented to person, place, and time.  Psychiatric:        Behavior: Behavior normal.    ED Results / Procedures / Treatments   Labs (all labs ordered are listed, but only abnormal results are displayed) Labs Reviewed - No data to display  EKG None  Radiology CT Maxillofacial Wo Contrast  Result Date: 04/01/2021 CLINICAL DATA:  Fall, facial trauma, jaw and lower dental pain EXAM: CT MAXILLOFACIAL WITHOUT CONTRAST TECHNIQUE: Multidetector CT imaging of the maxillofacial structures was performed. Multiplanar CT image reconstructions were also generated. COMPARISON:  None. FINDINGS: Osseous: There is a nondisplaced fracture in the anterior mandible at the midline (4-82). The fracture plane involves the root of the left lateral incisor. No other fracture is seen. There is no evidence of mandibular dislocation. The teeth appear intact, without evidence of fracture, though there is a prominent carious lesion of the left mandibular third molar. Orbits: Negative. No traumatic or inflammatory finding. Sinuses: There is mild mucosal thickening in the paranasal sinuses. Soft tissues: There is no abnormal fluid collection or lymphadenopathy to the level imaged. Limited intracranial: Imaged portions of the intracranial compartment are unremarkable. IMPRESSION: Nondisplaced parasymphyseal mandibular fracture with involvement of the root of the left lateral incisor. Electronically Signed   By: Valetta Mole M.D.   On: 04/01/2021 08:03    Procedures Procedures   Medications Ordered in ED Medications  acetaminophen (TYLENOL) tablet 1,000 mg (1,000 mg Oral Given 04/01/21 0757)  oxyCODONE (Oxy IR/ROXICODONE) immediate release tablet 5 mg (5 mg Oral Given  04/01/21 0757)    ED Course  I have reviewed the triage vital signs and the nursing notes.  Pertinent labs & imaging results that were available during my care of the patient were reviewed by me and considered in my medical decision making (see chart for details).    MDM Rules/Calculators/A&P                           40 yo M with a chief complaints of jaw pain.  This happened after he fell out of a chair and landed on his chin.  Been going on for couple days.  Worsening after yawning this morning.  CT scan of the face viewed by me with looks like a fracture through  the mentum.   I discussed the case with Dr. Marla Roe, plastics, recommends liquid diet will follow up with him in the office on Friday.   8:47 AM:  I have discussed the diagnosis/risks/treatment options with the patient and believe the pt to be eligible for discharge home to follow-up with PCP. We also discussed returning to the ED immediately if new or worsening sx occur. We discussed the sx which are most concerning (e.g., sudden worsening pain, fever, inability to tolerate by mouth) that necessitate immediate return. Medications administered to the patient during their visit and any new prescriptions provided to the patient are listed below.  Medications given during this visit Medications  acetaminophen (TYLENOL) tablet 1,000 mg (1,000 mg Oral Given 04/01/21 0757)  oxyCODONE (Oxy IR/ROXICODONE) immediate release tablet 5 mg (5 mg Oral Given 04/01/21 0757)     The patient appears reasonably screen and/or stabilized for discharge and I doubt any other medical condition or other [facility] requiring further screening, evaluation, or treatment in the ED at this time prior to discharge.   Final Clinical Impression(s) / ED Diagnoses Final diagnoses:  Facial fracture ([place])    Rx / DC Orders ED Discharge Orders          Ordered    morphine (MSIR) 15 MG tablet  Every 4 hours PRN        04/01/21 Garfield,  [place], DO 04/01/21 [phone]

## 2021-04-01 NOTE — ED Triage Notes (Signed)
Pt arrives via EMS. Pt states he accidentally fell Friday and landed on his face. Pt reports continued jaw pain and lower dental pain.

## 2021-04-01 NOTE — Discharge Instructions (Signed)
Liquid diet only.  Call the plastic surgeon to see her in the office on Friday.  Please return for worsening pain inability to swallow.

## 2021-04-04 ENCOUNTER — Ambulatory Visit (INDEPENDENT_AMBULATORY_CARE_PROVIDER_SITE_OTHER): Payer: Self-pay | Admitting: Plastic Surgery

## 2021-04-04 ENCOUNTER — Other Ambulatory Visit: Payer: Self-pay

## 2021-04-04 DIAGNOSIS — S0269XA Fracture of mandible of other specified site, initial encounter for closed fracture: Secondary | ICD-10-CM

## 2021-04-04 DIAGNOSIS — T40601A Poisoning by unspecified narcotics, accidental (unintentional), initial encounter: Secondary | ICD-10-CM

## 2021-04-06 ENCOUNTER — Encounter: Payer: Self-pay | Admitting: Plastic Surgery

## 2021-04-06 DIAGNOSIS — T40601A Poisoning by unspecified narcotics, accidental (unintentional), initial encounter: Secondary | ICD-10-CM | POA: Insufficient documentation

## 2021-04-06 DIAGNOSIS — S02609A Fracture of mandible, unspecified, initial encounter for closed fracture: Secondary | ICD-10-CM | POA: Insufficient documentation

## 2021-04-06 NOTE — Progress Notes (Signed)
Patient ID: Robert Duffy, male    DOB: 01/23/1981, 40 y.o.   MRN: [id number]   Chief Complaint  Patient presents with   Follow-up   Facial Injury    The patient is a 40 year old male here for evaluation of his mandible.  Approximately December 3 he was getting out of the barber chair when he lost balance and fell.  He landed on his chin and had a little bit of pain in the next morning.  When he yawned he felt like something shifted and he had trouble moving his jaw.  He denies any other injuries or medical conditions.  He was seen in the emergency room on 12 6 and the CT scan was positive for a parasymphyseal mandibular fracture slightly to the left.  It is not displaced in his condyles appear to be intact.  On exam he has a little bit of discomfort and he says it feels funny to try to open wide.  He has been eating soft food.  He does not want surgery if it is possible to avoid.  He has very good occlusion and at this point it does not appear he has swelling or bruising of his face.   Review of Systems  Constitutional:  Positive for appetite change. Negative for activity change and chills.  Eyes: Negative.   Respiratory: Negative.    Cardiovascular: Negative.   Gastrointestinal: Negative.   Endocrine: Negative.   Genitourinary: Negative.   Musculoskeletal: Negative.   Skin: Negative.   Neurological: Negative.   Hematological: Negative.   Psychiatric/Behavioral: Negative.     History reviewed. No pertinent past medical history.  History reviewed. No pertinent surgical history.    Current Outpatient Medications:    famotidine (PEPCID) 20 MG tablet, Take 1 tablet (20 mg total) by mouth 2 (two) times daily., Disp: 30 tablet, Rfl: 0   morphine (MSIR) 15 MG tablet, Take 0.5 tablets (7.5 mg total) by mouth every 4 (four) hours as needed for severe pain., Disp: 7 tablet, Rfl: 0   omeprazole (PRILOSEC) 20 MG capsule, Take 1 capsule (20 mg total) by mouth daily., Disp: 30 capsule,  Rfl: 0   Objective:   There were no vitals filed for this visit.  Physical Exam Vitals and nursing note reviewed.  Constitutional:      Appearance: Normal appearance.  Cardiovascular:     Rate and Rhythm: Normal rate.     Pulses: Normal pulses.  Pulmonary:     Effort: Pulmonary effort is normal.  Skin:    Coloration: Skin is not jaundiced.     Findings: No bruising or lesion.  Neurological:     Mental Status: He is alert and oriented to person, place, and time.  Psychiatric:        Mood and Affect: Mood normal.        Behavior: Behavior normal.        Thought Content: Thought content normal.    Assessment & Plan:  Opiate overdose, accidental or unintentional, initial encounter (HCC)  Closed fracture of mandible of other site, initial encounter [facility])  We discussed the options of surgical management with maxillomandibular fixation and plates.  The patient states that he is willing to do a liquid diet for the next 6 weeks.  He would like to avoid surgery this is reasonable and we will provide him with a stable repair if he does not chew.  We discussed him changing his mind.  He certainly can.  The consequences of  the bone moving will be a malunion which would require an external fixator and a lot of problems for him.  He seems to be committed to this.  I do want to see him back in the next few weeks to make sure he is healing without any issues.  The patient is in agreement.  Pictures were obtained of the patient and placed in the chart with the patient's or guardian's permission.   Alena Bills Burt Piatek, DO

## 2021-05-02 ENCOUNTER — Ambulatory Visit: Payer: Self-pay | Admitting: Surgical

## 2022-01-28 IMAGING — CR DG CHEST 2V
2 series · 2 of 2 positions shown · non-contrast
Comparison: Prior chest x-ray 07/24/2020

CLINICAL DATA: Chest pain

EXAM:
CHEST - 2 VIEW

[w chest pa]
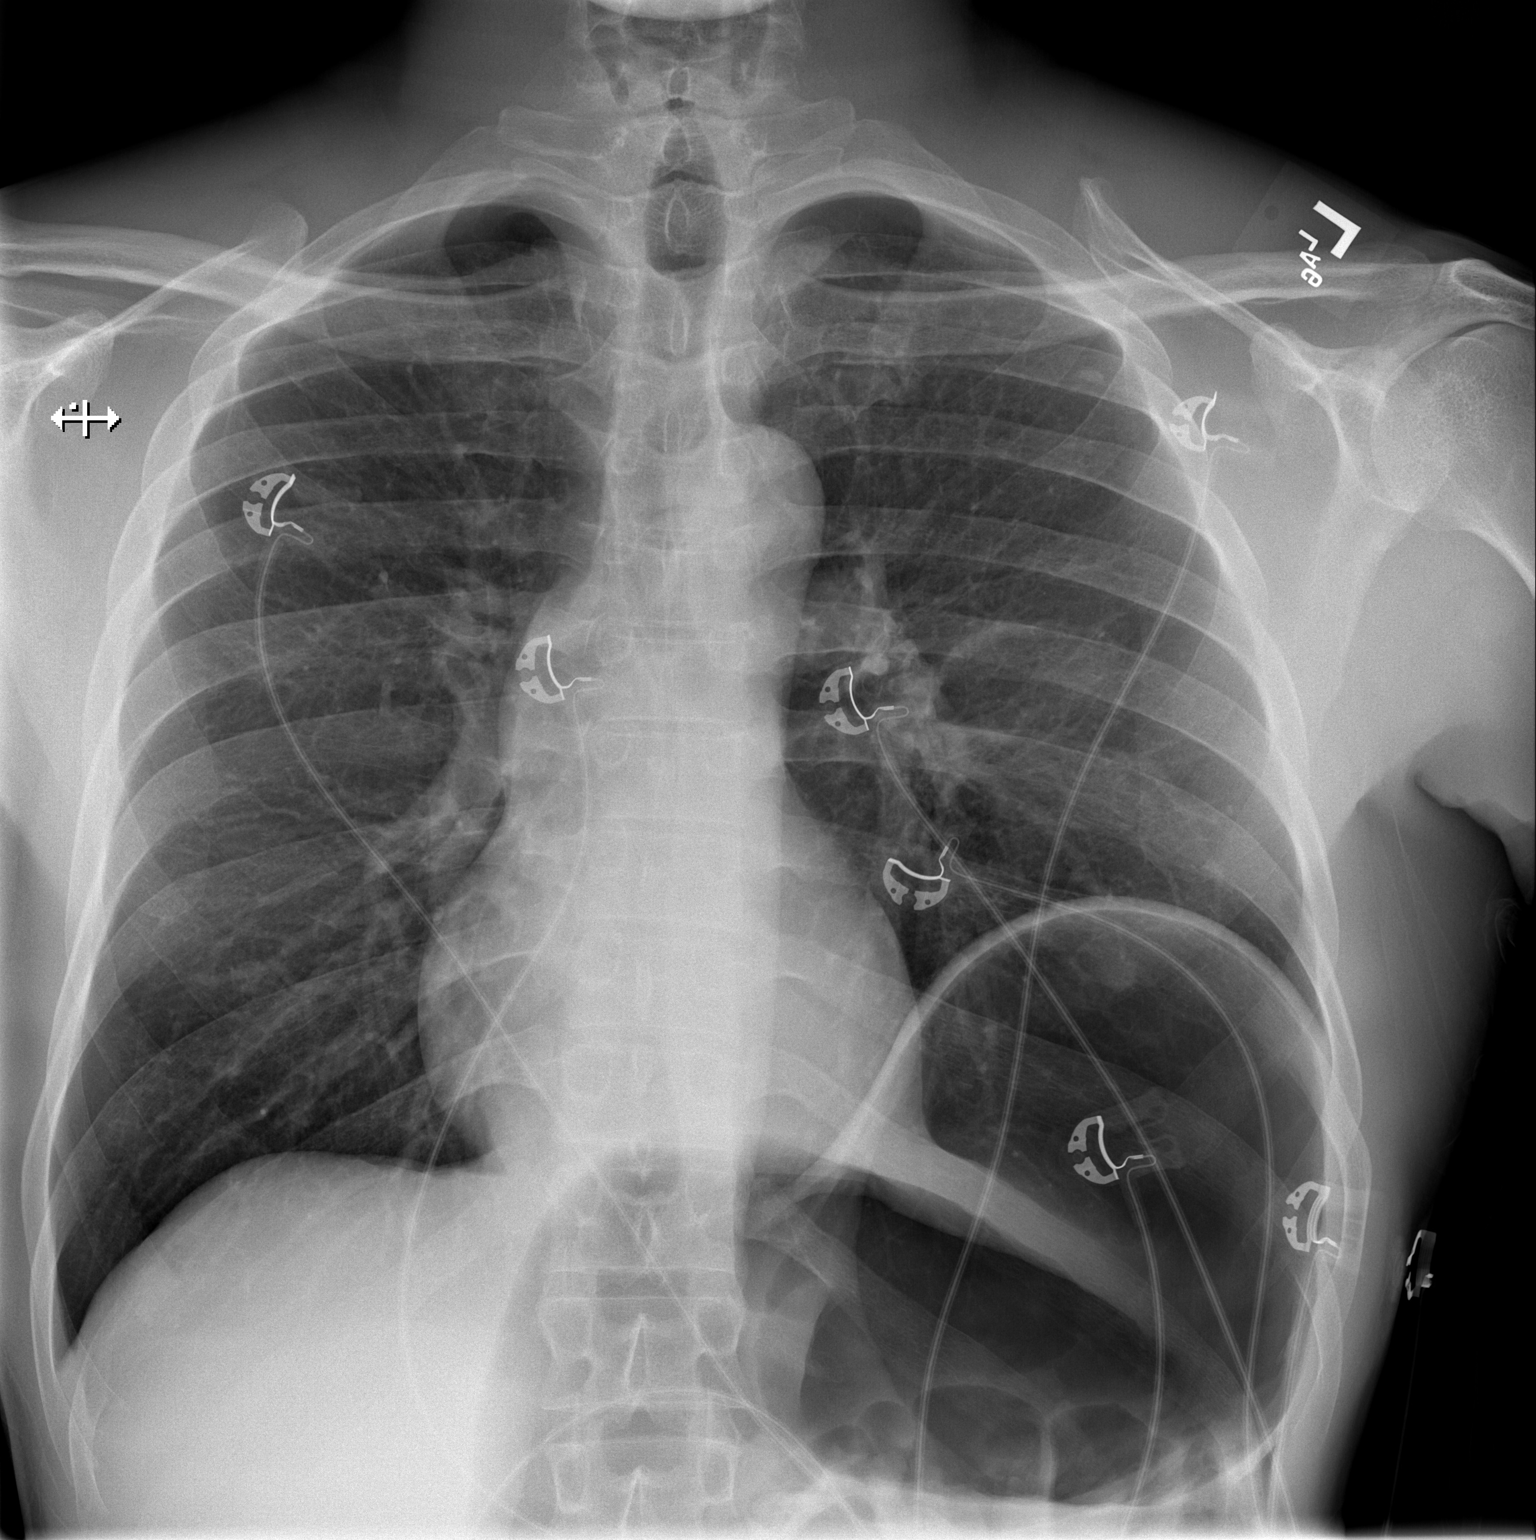

[w chest lat]
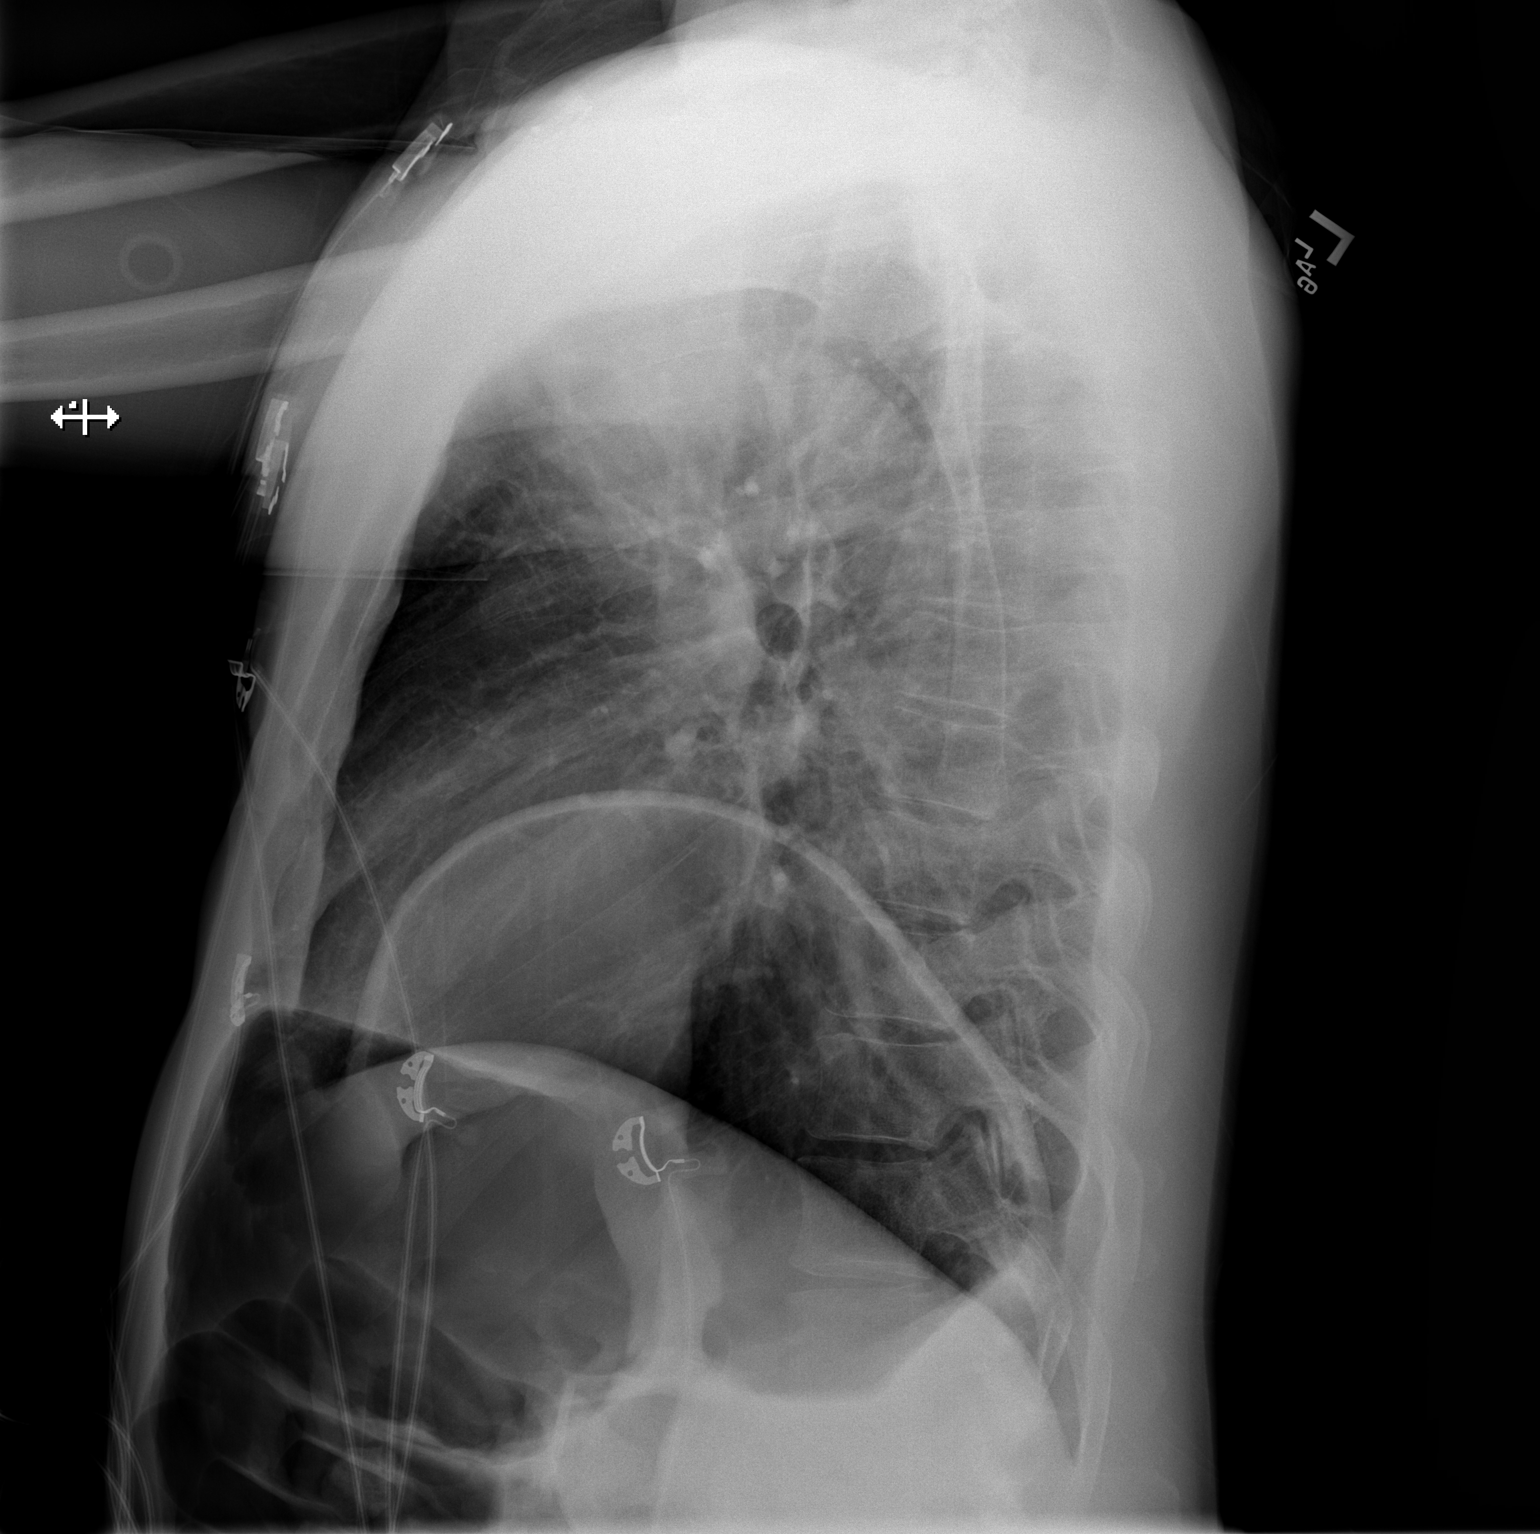

[2 of 2 positions shown; findings below may reference images not displayed]

FINDINGS: Cardiac and mediastinal contours remain within normal limits.
Gaseous distension of the stomach with elevation of the left
hemidiaphragm. The lungs are clear. No pleural effusion or
pneumothorax. No acute osseous abnormality.
IMPRESSION: 1. Marked gaseous distension of the stomach with resultant elevation
of the left hemidiaphragm. This could represent a source for left
upper quadrant/left-sided chest discomfort.
2. No acute cardiopulmonary process.

## 2022-08-13 ENCOUNTER — Emergency Department (HOSPITAL_COMMUNITY)
Admission: EM | Admit: 2022-08-13 | Discharge: 2022-08-13 | Disposition: A | Discharge status: Home / Self Care | Payer: Self-pay | Attending: Emergency Medicine | Admitting: Emergency Medicine

## 2022-08-13 ENCOUNTER — Other Ambulatory Visit: Payer: Self-pay

## 2022-08-13 DIAGNOSIS — Z77098 Contact with and (suspected) exposure to other hazardous, chiefly nonmedicinal, chemicals: Secondary | ICD-10-CM | POA: Insufficient documentation

## 2022-08-13 NOTE — ED Provider Notes (Signed)
[place] EMERGENCY DEPARTMENT AT [facility] Provider Note   CSN: [id number] Arrival date & time: 08/13/22  1016     History  Chief Complaint  Patient presents with   Allergic Reaction    Robert Duffy is a 42 y.o. male.  42 year old male presents with possible mucous nausea.  States that he was exposed to some cleaning chemicals.  Had a tickle in his throat which is since resolved.  Denies any dyspnea.  No rashes.  Felt better once he was removed from the environment.       Home Medications Prior to Admission medications   Medication Sig Start Date End Date Taking? Authorizing Provider  famotidine (PEPCID) 20 MG tablet Take 1 tablet (20 mg total) by mouth 2 (two) times daily. 07/18/20   Renne Crigler, PA-C  morphine (MSIR) 15 MG tablet Take 0.5 tablets (7.5 mg total) by mouth every 4 (four) hours as needed for severe pain. 04/01/21   Melene Plan, DO  omeprazole (PRILOSEC) 20 MG capsule Take 1 capsule (20 mg total) by mouth daily. 07/31/20   Horton, Mayer Masker, MD      Allergies    Patient has no known allergies.    Review of Systems   Review of Systems  All other systems reviewed and are negative.   Physical Exam Updated Vital Signs BP 133/87   Pulse 79   Temp 97.6 F (36.4 C)   Resp 16   SpO2 96%  Physical Exam Vitals and nursing note reviewed.  Constitutional:      General: He is not in acute distress.    Appearance: Normal appearance. He is well-developed. He is not toxic-appearing.  HENT:     Head: Normocephalic and atraumatic.  Eyes:     General: Lids are normal.     Conjunctiva/sclera: Conjunctivae normal.     Pupils: Pupils are equal, round, and reactive to light.  Neck:     Thyroid: No thyroid mass.     Trachea: No tracheal deviation.  Cardiovascular:     Rate and Rhythm: Normal rate and regular rhythm.     Heart sounds: Normal heart sounds. No murmur heard.    No gallop.  Pulmonary:     Effort: Pulmonary effort is normal. No  respiratory distress.     Breath sounds: Normal breath sounds. No stridor. No decreased breath sounds, wheezing, rhonchi or rales.  Abdominal:     General: There is no distension.     Palpations: Abdomen is soft.     Tenderness: There is no abdominal tenderness. There is no rebound.  Musculoskeletal:        General: No tenderness. Normal range of motion.     Cervical back: Normal range of motion and neck supple.  Skin:    General: Skin is warm and dry.     Findings: No abrasion or rash.  Neurological:     Mental Status: He is alert and oriented to person, place, and time. Mental status is at baseline.     GCS: GCS eye subscore is 4. GCS verbal subscore is 5. GCS motor subscore is 6.     Cranial Nerves: No cranial nerve deficit.     Sensory: No sensory deficit.     Motor: Motor function is intact.  Psychiatric:        Attention and Perception: Attention normal.        Speech: Speech normal.        Behavior: Behavior normal.  ED Results / Procedures / Treatments   Labs (all labs ordered are listed, but only abnormal results are displayed) Labs Reviewed - No data to display  EKG None  Radiology No results found.  Procedures Procedures    Medications Ordered in ED Medications - No data to display  ED Course/ Medical Decision Making/ A&P                             Medical Decision Making  Patient's lungs are clear at this time.  Pulse oximetry stable.  No indication for imaging of his chest.  No evidence of anaphylactic reaction.  Will discharge home        Final Clinical Impression(s) / ED Diagnoses Final diagnoses:  Chemical exposure    Rx / DC Orders ED Discharge Orders     None         Lorre Nick, MD 08/13/22 1039

## 2022-08-13 NOTE — ED Triage Notes (Signed)
EMS reports Pt from home, c/o possible allergic reaction to chemical odors used by restaurant next door. Pt in no distress, denies SOB or any symptoms of anaphylactic reaction. C/o of tickle in his throat.

## 2022-08-13 NOTE — Discharge Instructions (Signed)
Followup with your doctor as needed

## 2022-09-05 ENCOUNTER — Emergency Department (HOSPITAL_COMMUNITY)
Admission: EM | Admit: 2022-09-05 | Discharge: 2022-09-05 | Disposition: A | Discharge status: Home / Self Care | Payer: Self-pay | Attending: Student | Admitting: Student

## 2022-09-05 ENCOUNTER — Other Ambulatory Visit: Payer: Self-pay

## 2022-09-05 DIAGNOSIS — T6591XA Toxic effect of unspecified substance, accidental (unintentional), initial encounter: Secondary | ICD-10-CM | POA: Insufficient documentation

## 2022-09-05 DIAGNOSIS — R42 Dizziness and giddiness: Secondary | ICD-10-CM | POA: Insufficient documentation

## 2022-09-05 DIAGNOSIS — Z77098 Contact with and (suspected) exposure to other hazardous, chiefly nonmedicinal, chemicals: Secondary | ICD-10-CM

## 2022-09-05 MED ORDER — ALUM & MAG HYDROXIDE-SIMETH 200-200-20 MG/5ML PO SUSP
30.0000 mL | Freq: Once | ORAL | Status: AC
  Administered 2022-09-05: 30 mL via ORAL
  Filled 2022-09-05: qty 30

## 2022-09-05 NOTE — ED Provider Notes (Signed)
[place] EMERGENCY DEPARTMENT AT [facility] Provider Note   CSN: [id number] Arrival date & time: 09/05/22  2034    History  Chief Complaint  Patient presents with   Chemical Exposure    Robert Duffy is a 42 y.o. male no significant past medical history here for evaluation of concern for allergic reaction.  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  He states 2 to 3 days ago he thinks these were "too strong."  He states initially he had some lightheadedness and possibly "tasted iron in my mouth."  However this has resolved.  He came here to "just be checked out."  He denies any fever, nausea, vomiting, chest pain, shortness of breath, weakness, numbness, coughing, abdominal pain, diarrhea, dysuria, gait abnormalities.  States he needs a note for work to not be around [employer], contracting.  States this happened previously about 1 month ago and he needed a note to be out at that time as well.  He is also requesting a dose of his home Maalox.  No abdominal pain, vomiting.  Patient states he is "very sensitive to pollution which is why I needed to be seen."  HPI     Home Medications Prior to Admission medications   Medication Sig Start Date End Date Taking? Authorizing Provider  famotidine (PEPCID) 20 MG tablet Take 1 tablet (20 mg total) by mouth 2 (two) times daily. Patient not taking: Reported on 09/05/2022 07/18/20   Renne Crigler, PA-C      Allergies    Patient has no known allergies.    Review of Systems   Review of Systems  Constitutional: Negative.   HENT: Negative.    Respiratory: Negative.    Cardiovascular: Negative.   Gastrointestinal: Negative.   Genitourinary: Negative.   Musculoskeletal: Negative.   Skin: Negative.   Neurological: Negative.   All other systems reviewed and are negative.   Physical Exam Updated Vital Signs BP 126/85   Pulse 87   Temp 98.3 F (36.8 C) (Oral)   Resp 16   SpO2 98%   Physical Exam Vitals and nursing note reviewed.  Constitutional:      General: He is not in acute distress.    Appearance: He is well-developed. He is not ill-appearing, toxic-appearing or diaphoretic.  HENT:     Head: Normocephalic and atraumatic.     Right Ear: Tympanic membrane, ear canal and external ear normal.     Nose: Nose normal. No congestion or rhinorrhea.     Comments: No congestion, rhinorrhea    Mouth/Throat:     Mouth: Mucous membranes are moist.     Comments: Tongue midline, no intraoral lesions.  No pooling of secretions, no angioedema Eyes:     Pupils: Pupils are equal, round, and reactive to light.  Cardiovascular:     Rate and Rhythm: Normal rate and regular rhythm.     Pulses: Normal pulses.     Heart sounds: Normal heart sounds.  Pulmonary:     Effort: Pulmonary effort is normal. No respiratory distress.     Breath sounds: Normal breath sounds.     Comments: Clear to auscultation bilaterally Abdominal:     General: Bowel sounds are normal. There is no distension.     Palpations: Abdomen is soft.     Tenderness: There is no abdominal tenderness. There is no guarding or rebound.  Musculoskeletal:        General: No swelling or tenderness.  Normal range of motion.     Cervical back: Normal range of motion and neck supple.     Right lower leg: No edema.     Left lower leg: No edema.  Skin:    General: Skin is warm and dry.     Comments: No rashes or lesions  Neurological:     General: No focal deficit present.     Mental Status: He is alert and oriented to person, place, and time.     Cranial Nerves: Cranial nerves 2-12 are intact. No cranial nerve deficit.     Sensory: Sensation is intact. No sensory deficit.     Motor: Motor function is intact. No weakness.     Gait: Gait is intact. Gait normal.  Psychiatric:     Comments: Flat affect     ED Results / Procedures / Treatments   Labs (all labs ordered are listed, but only abnormal results are  displayed) Labs Reviewed - No data to display  EKG None  Radiology No results found.  Procedures Procedures    Medications Ordered in ED Medications  alum & mag hydroxide-simeth (MAALOX/MYLANTA) 200-200-20 MG/5ML suspension 30 mL (30 mLs Oral Given 09/05/22 2105)    ED Course/ Medical Decision Making/ A&P   42 year old here for evaluation of possible allergic reaction.  States the place next to his employer has been using cleaning chemicals.  2 to 3 days ago he was exposed to these.  Denies drinking these, states "I breathed them in when I walked by." He had some initial lightheadedness and "tasted iron" in his mouth at that time however this resolved. He came today for assessment and is requesting a work note so hedoes not to be exposed these chemicals anymore.  He is currently asymptomatic.  He has been eating and drinking without difficulty.  No numbness, weakness, chest pain, shortness of breath, cough, hemoptysis, headache, emesis  While he did admit to some lightheadedness when this initially occurred (3 days ago)  he is currently asymptomatic.  He has a nonfocal neuroexam without deficits. No gait abnormalities, facial droop, numbness, weakness, I have low suspicion for CVA, dissection, bleed, encephalopathy.  His heart and lungs are clear.  I low suspicion for pneumonitis, pneumonia, PE, pneumothorax.  His mouth is clear without evidence of angioedema, lesions, no pooling of secretions he has no evidence of airway compromise.  He is requesting a dose of his home Maalox.  He denies any nausea, vomiting, abdominal pain.  Will give him his home dose.  I encouraged him to exposure to remove himself from the situation if he knows there will be exposure or to use a face protective wear.  He has no rashes or lesions.  He has no evidence of allergic reaction at this time.  Will DC home with symptomatic management  The patient has been appropriately medically screened and/or stabilized in  the ED. I have low suspicion for any other emergent medical condition which would require further screening, evaluation or treatment in the ED or require inpatient management.  Patient is hemodynamically stable and in no acute distress.  Patient able to ambulate in department prior to ED.  Evaluation does not show acute pathology that would require ongoing or additional emergent interventions while in the emergency department or further inpatient treatment.  I have discussed the diagnosis with the patient and answered all questions.  Pain is been managed while in the emergency department and patient has no further complaints prior to discharge.  Patient  is comfortable with plan discussed in room and is stable for discharge at this time.  I have discussed strict return precautions for returning to the emergency department.  Patient was encouraged to follow-up with PCP/specialist refer to at discharge.                              Medical Decision Making Amount and/or Complexity of Data Reviewed Independent Historian: EMS External Data Reviewed: labs, radiology, ECG and notes.  Risk OTC drugs. Decision regarding hospitalization. Diagnosis or treatment significantly limited by social determinants of health.           Final Clinical Impression(s) / ED Diagnoses Final diagnoses:  Chemical exposure    Rx / DC Orders ED Discharge Orders     None         Sharron Simpson A, PA-C 09/05/22 2116    Glendora Score, MD 09/06/22 1401

## 2022-09-05 NOTE — Discharge Instructions (Addendum)
It was a pleasure taking care of you here in the emergency department  Best thing to do is remove yourself from the chemicals  Return for new or worsening symptoms.

## 2022-09-05 NOTE — ED Triage Notes (Signed)
Pt BIB [facility] from home. Pt reports being exposed to ammonia near his home. Pt reports feeling dizzy and can taste blood in mouth. No blood observed in mucous membranes. Aox4 126/88 90HR 95%RA 113CBG 16RR

## 2022-09-24 ENCOUNTER — Other Ambulatory Visit: Payer: Self-pay

## 2022-09-24 ENCOUNTER — Encounter (HOSPITAL_COMMUNITY): Payer: Self-pay

## 2022-09-24 ENCOUNTER — Emergency Department (HOSPITAL_COMMUNITY)
Admission: EM | Admit: 2022-09-24 | Discharge: 2022-09-25 | Disposition: A | Discharge status: Home / Self Care | Payer: Self-pay | Attending: Emergency Medicine | Admitting: Emergency Medicine

## 2022-09-24 ENCOUNTER — Emergency Department (HOSPITAL_COMMUNITY): Payer: Self-pay

## 2022-09-24 DIAGNOSIS — M542 Cervicalgia: Secondary | ICD-10-CM | POA: Insufficient documentation

## 2022-09-24 DIAGNOSIS — K219 Gastro-esophageal reflux disease without esophagitis: Secondary | ICD-10-CM | POA: Insufficient documentation

## 2022-09-24 MED ORDER — PANTOPRAZOLE SODIUM 40 MG PO TBEC
40.0000 mg | DELAYED_RELEASE_TABLET | Freq: Once | ORAL | Status: AC
Start: 2022-09-24 — End: 2022-09-24
  Administered 2022-09-24: 40 mg via ORAL
  Filled 2022-09-24: qty 1

## 2022-09-24 MED ORDER — CYCLOBENZAPRINE HCL 10 MG PO TABS
10.0000 mg | ORAL_TABLET | Freq: Once | ORAL | Status: AC
Start: 2022-09-24 — End: 2022-09-24
  Administered 2022-09-24: 10 mg via ORAL
  Filled 2022-09-24: qty 1

## 2022-09-24 NOTE — ED Provider Notes (Signed)
[place] EMERGENCY DEPARTMENT AT [facility] Provider Note   CSN: [id number] Arrival date & time: 09/24/22  2208     History  Chief Complaint  Patient presents with   Neck Injury    Robert Duffy is a 42 y.o. male presents to the ED today for neck pain.  Patient has been having intermittent pain in the neck for the past several years. He has been seen for similar complaints in the past. Patient states that it feels like there is "air" in his neck that comes and goes on its own.  Pain is worse with movement. He also admits to chest pressure at this time. He denies fever, abdominal pain, facial drooping, or loss of consciousness. He reports intermittent headaches, which are not associated with the neck pain.    Home Medications Prior to Admission medications   Medication Sig Start Date End Date Taking? Authorizing Provider  pantoprazole (PROTONIX) 40 MG tablet Take 1 tablet (40 mg total) by mouth daily for 14 days. 09/25/22 10/09/22 Yes Maxwell Marion, PA-C  famotidine (PEPCID) 20 MG tablet Take 1 tablet (20 mg total) by mouth 2 (two) times daily. Patient not taking: Reported on 09/05/2022 07/18/20   Renne Crigler, PA-C      Allergies    Patient has no known allergies.    Review of Systems   Review of Systems  Musculoskeletal:  Positive for neck stiffness.  All other systems reviewed and are negative.   Physical Exam Updated Vital Signs BP 115/71 (BP Location: Left Arm)   Pulse 67   Temp 97.8 F (36.6 C) (Oral)   Resp 16   Ht 6' (1.829 m)   Wt 83.9 kg   SpO2 100%   BMI 25.09 kg/m  Physical Exam Vitals and nursing note reviewed.  Constitutional:      Appearance: Normal appearance.  HENT:     Head: Normocephalic and atraumatic.     Mouth/Throat:     Mouth: Mucous membranes are moist.  Eyes:     Conjunctiva/sclera: Conjunctivae normal.     Pupils: Pupils are equal, round, and reactive to light.  Neck:     Comments: Tenderness to right side of  neck Cardiovascular:     Rate and Rhythm: Normal rate and regular rhythm.     Pulses: Normal pulses.     Heart sounds: Normal heart sounds.  Pulmonary:     Effort: Pulmonary effort is normal.     Breath sounds: Normal breath sounds.  Abdominal:     Palpations: Abdomen is soft.     Tenderness: There is no abdominal tenderness.  Musculoskeletal:        General: Normal range of motion.     Cervical back: Normal range of motion. Tenderness present.  Skin:    General: Skin is warm and dry.     Capillary Refill: Capillary refill takes less than 2 seconds.     Findings: No rash.  Neurological:     General: No focal deficit present.     Mental Status: He is alert.  Psychiatric:        Mood and Affect: Mood normal.        Behavior: Behavior normal.     ED Results / Procedures / Treatments   Labs (all labs ordered are listed, but only abnormal results are displayed) Labs Reviewed  CBC WITH DIFFERENTIAL/PLATELET - Abnormal; Notable for the following components:      Result Value   Hemoglobin 12.6 (*)  HCT 38.8 (*)    All other components within normal limits  I-STAT CHEM 8, ED - Abnormal; Notable for the following components:   Potassium 5.2 (*)    Glucose, Bld 131 (*)    Calcium, Ion 1.10 (*)    All other components within normal limits  POTASSIUM  TROPONIN I (HIGH SENSITIVITY)  TROPONIN I (HIGH SENSITIVITY)    EKG EKG Interpretation  Date/Time:  Friday Sep 25 2022 02:21:34 EDT Ventricular Rate:  61 PR Interval:  134 QRS Duration: 92 QT Interval:  434 QTC Calculation: 436 R Axis:   41 Text Interpretation: Normal sinus rhythm Confirmed by Nicanor Alcon, April ([id number]) on 09/25/2022 2:23:19 AM  Radiology CT Angio Neck W and/or Wo Contrast  Result Date: 09/25/2022 CLINICAL DATA:  Neck pain, prior chest injury EXAM: CT ANGIOGRAPHY NECK TECHNIQUE: Multidetector CT imaging of the neck was performed using the standard protocol during bolus administration of intravenous contrast.  Multiplanar CT image reconstructions and MIPs were obtained to evaluate the vascular anatomy. Carotid stenosis measurements (when applicable) are obtained utilizing NASCET criteria, using the distal internal carotid diameter as the denominator. RADIATION DOSE REDUCTION: This exam was performed according to the departmental dose-optimization program which includes automated exposure control, adjustment of the mA and/or kV according to patient size and/or use of iterative reconstruction technique. CONTRAST:  75mL OMNIPAQUE IOHEXOL 350 MG/ML SOLN COMPARISON:  None Available. FINDINGS: Aortic arch: Standard branching. Imaged portion shows no evidence of aneurysm or dissection. No significant stenosis of the major arch vessel origins. Right carotid system: No evidence of stenosis, dissection, or occlusion. Intracranial right ICA and its visualized branches are without additional acute finding. Left carotid system: No evidence of stenosis, dissection, or occlusion. Intracranial left ICA and its visualized branches are without additional acute finding. Vertebral arteries: No evidence of stenosis, dissection, or occlusion. The left vertebral artery primarily supplies the left PICA. Both vertebral arteries are patent to the vertebrobasilar junction. The right PICA is not definitively visualized. The basilar artery, superior cerebellar arteries, and proximal PCAs are unremarkable. The bilateral posterior communicating arteries are patent. Skeleton: No acute osseous abnormality. Degenerative changes in the cervical spine with reversal of the normal cervical lordosis and 3 mm anterolisthesis of C3 on C4, which is likely facet mediated. No significant spinal canal stenosis. Other neck: No acute finding. Upper chest: No focal pulmonary opacity or pleural effusion. IMPRESSION: 1. No acute vascular abnormality in the neck. 2. Degenerative changes in the cervical spine with reversal of the normal cervical lordosis and 3 mm  anterolisthesis of C3 on C4, which is likely facet mediated. No significant spinal canal stenosis. Electronically Signed   By: Wiliam Ke M.D.   On: 09/25/2022 00:50   DG Chest 1 View  Result Date: 09/24/2022 CLINICAL DATA:  Chest discomfort EXAM: PORTABLE CHEST 1 VIEW COMPARISON:  07/31/2020 FINDINGS: The heart size and mediastinal contours are within normal limits. Both lungs are clear. The visualized skeletal structures are unremarkable. Nipple shadows are noted bilaterally. IMPRESSION: No acute abnormality noted. Electronically Signed   By: Alcide Clever M.D.   On: 09/24/2022 23:39    Procedures Procedures: not indicated.   Medications Ordered in ED Medications  cyclobenzaprine (FLEXERIL) tablet 10 mg (10 mg Oral Given 09/24/22 2348)  pantoprazole (PROTONIX) EC tablet 40 mg (40 mg Oral Given 09/24/22 2348)  iohexol (OMNIPAQUE) 350 MG/ML injection 75 mL (75 mLs Intravenous Contrast Given 09/25/22 0039)    ED Course/ Medical Decision Making/ A&P Clinical Course as of 09/25/22  4540  Fri Sep 25, 2022  0015 Patient reassessed. Well appearing. No nuchal rigidity or meningismus on exam. Preserved ROM of C-spine. No crepitus, lymphadenopathy. No carotid bruits on exam. Grip strength intact and equal b/l with preserved strength in all major muscle groups of upper extremities. Plan for CTA to r/o dissection, other vascular anomaly, mass, expanding hematoma. [KH]    Clinical Course User Index [KH] Antony Madura, PA-C                             Medical Decision Making Amount and/or Complexity of Data Reviewed Labs: ordered. Radiology: ordered.  Risk Prescription drug management.   This patient presents to the ED for concern of neck stiffness, this involves an extensive number of treatment options, and is a complaint that carries with it a high risk of complications and morbidity.   Differential diagnosis includes: meningitis, stroke, aneurysm, dissection, muscle strain, muscle spasm,      Co morbidities that complicate the patient evaluation  Atypical chest pain   Additional history obtained:  Additional history obtained from patient's previous ED records   Cardiac Monitoring / EKG:  The patient was maintained on a cardiac monitor.  I personally viewed and interpreted the cardiac monitored which showed: Normal sinus rhythm with a heart rate of 81 bpm.   Lab Tests:  I ordered and personally interpreted labs.  The pertinent results include:   Troponins are within normal limits CBC shows no signs of acute infection or anemia CMP was unremarkable - no electrolyte abnormalities, acute liver or gallbladder conditions. Initial potassium was elevated, likely hemolyzed. Repeat potassium was within normal limits.   Imaging Studies ordered:  I ordered imaging studies including CT angio neck and CXR I independently visualized and interpreted imaging which showed: CT angio neck: No acute vascular abnormality in the neck. Degenerative changes in the cervical spine with reversal of the normal cervical lordosis and 3 mm anterolisthesis of C3 on C4, which is likely facet mediated. No significant spinal canal stenosis. No acute findings on CXR I agree with the radiologist interpretation   Problem List / ED Course / Critical interventions / Medication management  Neck pain I ordered medications including: Flexeril and pantoprazole for muscle spasm and reflux  Reevaluation of the patient after these medicines showed that the patient improved I have reviewed the patients home medicines and have made adjustments as needed   Social Determinants of Health:  Access to health care Housing   Test / Admission - Considered:  He is hemodynamically stable and safe for discharge home Information given to get established with a PCP for further evaluation of GI symptoms.         Final Clinical Impression(s) / ED Diagnoses Final diagnoses:  Gastroesophageal reflux  disease without esophagitis    Rx / DC Orders ED Discharge Orders          Ordered    pantoprazole (PROTONIX) 40 MG tablet  Daily        09/25/22 0226              Maxwell Marion, PA-C 09/25/22 0745    Palumbo, April, MD 09/25/22 9811

## 2022-09-24 NOTE — ED Triage Notes (Signed)
Pt BIB [facility] from the community. Pt reports previous chest injury and tonight is having new neck pain when he turns his neck which feels like a gas bubble trapped in his neck. A+OX4, ambulated into ed unassisted BG129 BP 129/71 HR 96 O2 99% RR 16

## 2022-09-25 ENCOUNTER — Emergency Department (HOSPITAL_COMMUNITY): Payer: Self-pay

## 2022-09-25 LAB — CBC WITH DIFFERENTIAL/PLATELET
Abs Immature Granulocytes: 0.01 10*3/uL (ref 0.00–0.07)
Basophils Absolute: 0 10*3/uL (ref 0.0–0.1)
Basophils Relative: 1 %
Eosinophils Absolute: 0.2 10*3/uL (ref 0.0–0.5)
Eosinophils Relative: 4 %
HCT: 38.8 % — ABNORMAL LOW (ref 39.0–52.0)
Hemoglobin: 12.6 g/dL — ABNORMAL LOW (ref 13.0–17.0)
Immature Granulocytes: 0 %
Lymphocytes Relative: 41 %
Lymphs Abs: 2.2 10*3/uL (ref 0.7–4.0)
MCH: 28.2 pg (ref 26.0–34.0)
MCHC: 32.5 g/dL (ref 30.0–36.0)
MCV: 86.8 fL (ref 80.0–100.0)
Monocytes Absolute: 0.5 10*3/uL (ref 0.1–1.0)
Monocytes Relative: 10 %
Neutro Abs: 2.4 10*3/uL (ref 1.7–7.7)
Neutrophils Relative %: 44 %
Platelets: 297 10*3/uL (ref 150–400)
RBC: 4.47 MIL/uL (ref 4.22–5.81)
RDW: 12.8 % (ref 11.5–15.5)
WBC: 5.4 10*3/uL (ref 4.0–10.5)
nRBC: 0 % (ref 0.0–0.2)

## 2022-09-25 LAB — I-STAT CHEM 8, ED
BUN: 20 mg/dL (ref 6–20)
Calcium, Ion: 1.1 mmol/L — ABNORMAL LOW (ref 1.15–1.40)
Chloride: 99 mmol/L (ref 98–111)
Creatinine, Ser: 0.8 mg/dL (ref 0.61–1.24)
Glucose, Bld: 131 mg/dL — ABNORMAL HIGH (ref 70–99)
HCT: 40 % (ref 39.0–52.0)
Hemoglobin: 13.6 g/dL (ref 13.0–17.0)
Potassium: 5.2 mmol/L — ABNORMAL HIGH (ref 3.5–5.1)
Sodium: 136 mmol/L (ref 135–145)
TCO2: 31 mmol/L (ref 22–32)

## 2022-09-25 LAB — TROPONIN I (HIGH SENSITIVITY)
Troponin I (High Sensitivity): 3 ng/L (ref <18)
Troponin I (High Sensitivity): 3 ng/L (ref <18)

## 2022-09-25 LAB — POTASSIUM: Potassium: 3.8 mmol/L (ref 3.5–5.1)

## 2022-09-25 MED ORDER — PANTOPRAZOLE SODIUM 40 MG PO TBEC: 40.0000 mg | DELAYED_RELEASE_TABLET | Freq: Every day | ORAL | 0 refills | Status: DC

## 2022-09-25 MED ORDER — IOHEXOL 350 MG/ML SOLN
75.0000 mL | Freq: Once | INTRAVENOUS | Status: AC | PRN
  Administered 2022-09-25: 75 mL via INTRAVENOUS

## 2022-09-25 NOTE — Discharge Instructions (Addendum)
Take Pantoprazole every morning before meals for 14 days. This should help relieve the gas-like sensation that occurs. Avoid spicy and acidic foods, soda, and caffeine.  Call number under Additional Information within this paper work to get established with primary care.   Return to ED if: You have sudden pain in your arms, neck, jaw, teeth, or back. You suddenly feel sweaty, dizzy, or light-headed. You have chest pain or shortness of breath. You vomit and the vomit is green, yellow, or black, or it looks like blood or coffee grounds. You faint. You have stool that is red, bloody, or black. You cannot swallow, drink, or eat.

## 2022-09-26 LAB — AMB RESULTS CONSOLE CBG: Glucose: 107

## 2022-09-26 NOTE — Progress Notes (Unsigned)
PT. Did not indicate a PCP/BP 101/63/Pt. Has transportation SDOH need-resource provided. PT. Only concern is transportation at this time. PT. Attended health equity screening at [facility] shop.

## 2022-09-29 IMAGING — CT CT 3D ACQUISTION WKST
3 series · 16 of 47 positions shown, 19 images · non-contrast
Comparison: Same-day maxillofacial CT

CLINICAL DATA: Mandibular fracture

EXAM:
3-DIMENSIONAL CT IMAGE RENDERING ON ACQUISITION WORKSTATION
TECHNIQUE: 3-dimensional CT images were rendered by post-processing of the
original CT data on an acquisition workstation. The 3-dimensional CT
images were interpreted and findings were reported in the
accompanying complete CT report for this study

[Series 3: max soft · axial · 0.48mm/px · z∈[-216,-42]mm · 10 of 101 slices shown, 13 images]
[im 7/101  brain]
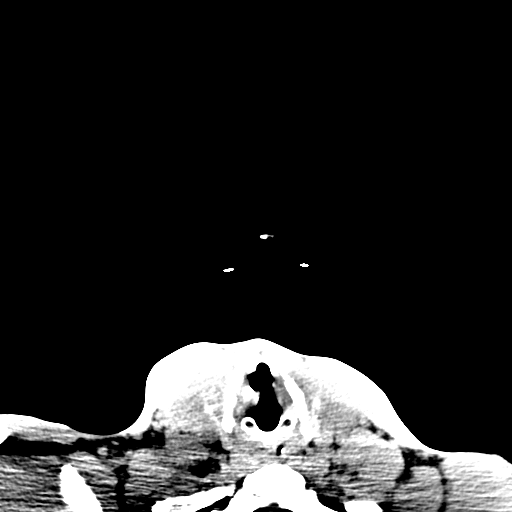
[im 7/101  bone]
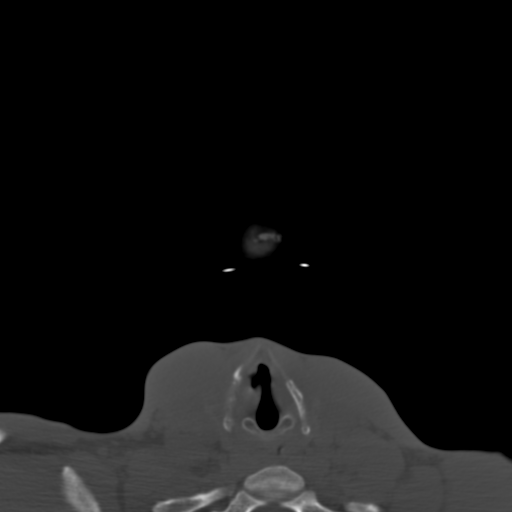
[im 18/101  brain]
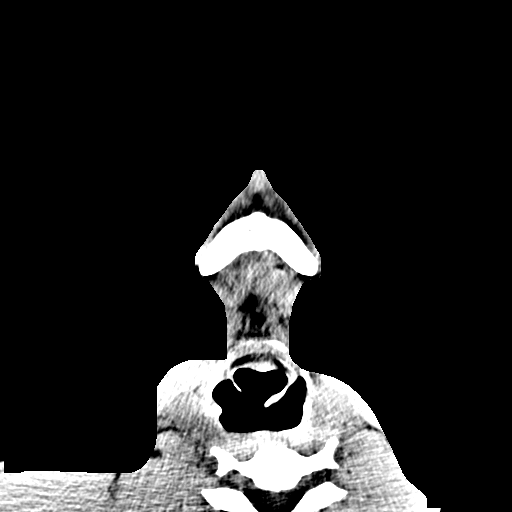
[im 28/101  brain]
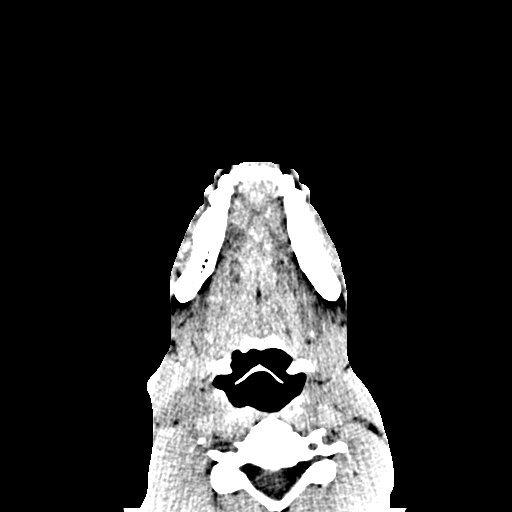
[im 35/101  brain]
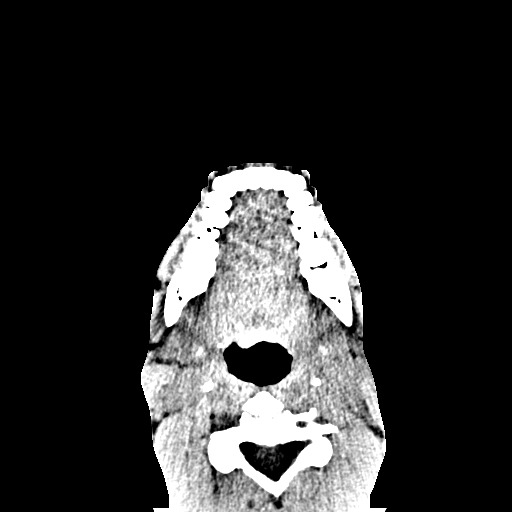
[im 45/101  brain]
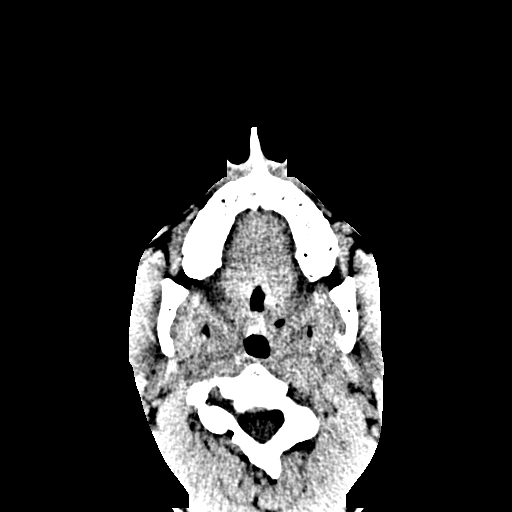
[im 45/101  bone]
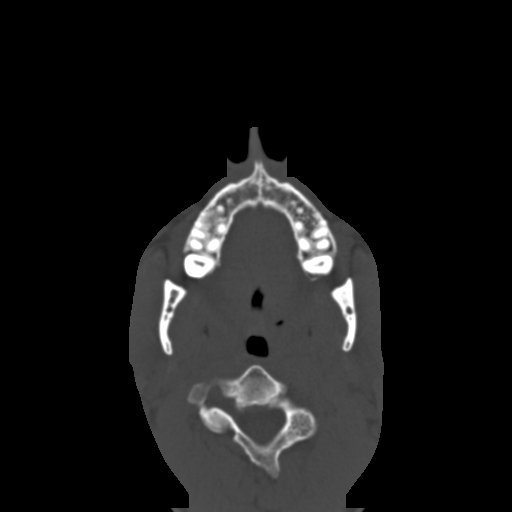
[im 56/101  brain]
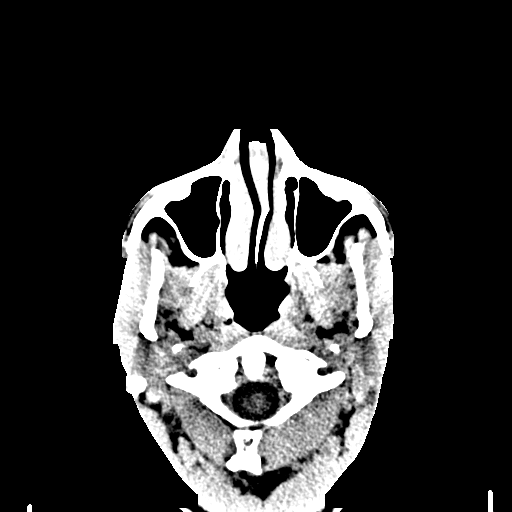
[im 66/101  brain]
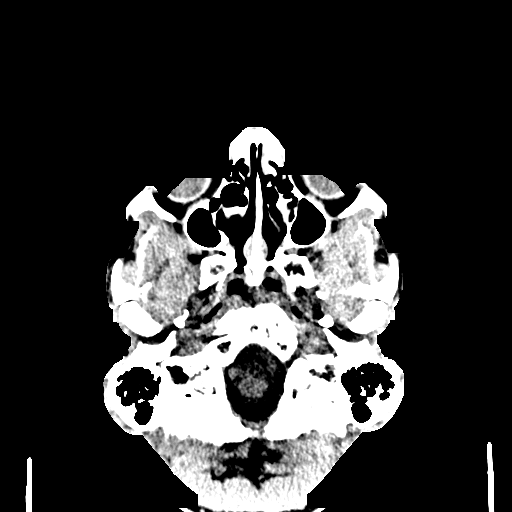
[im 76/101  brain]
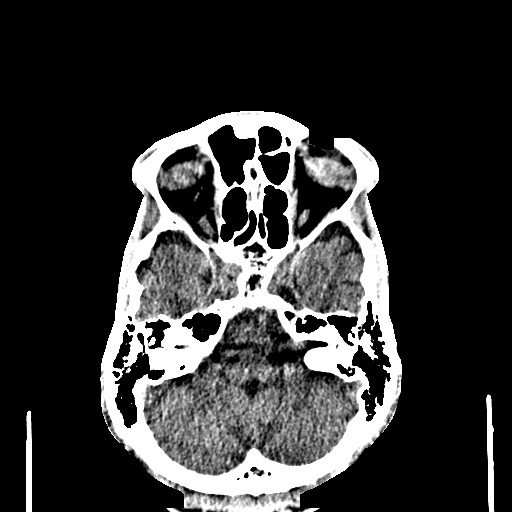
[im 83/101  brain]
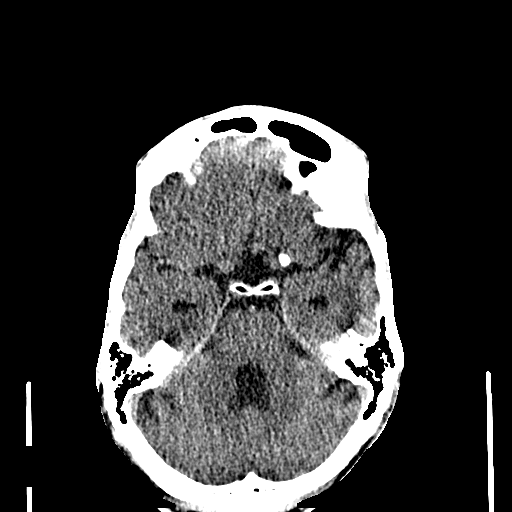
[im 83/101  bone]
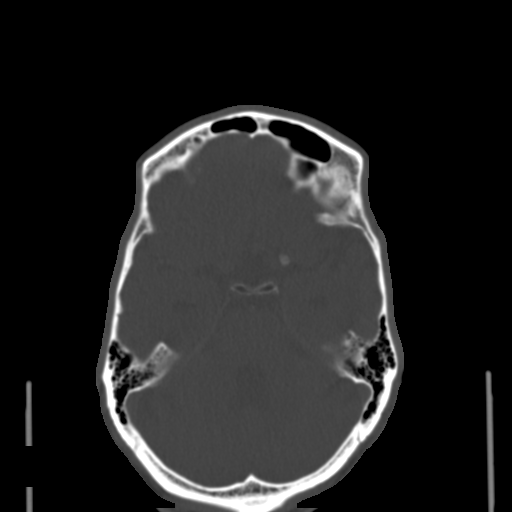
[im 94/101  brain]
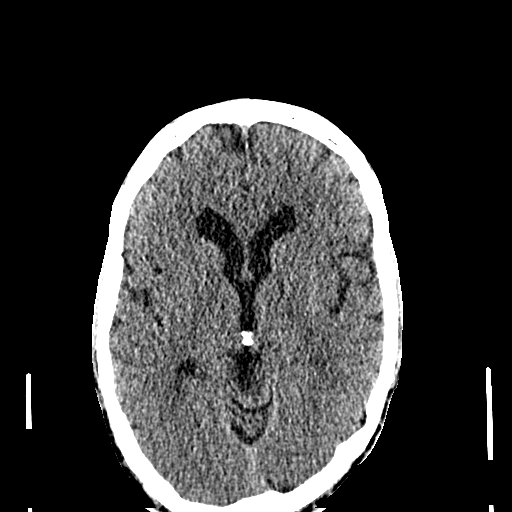

[Series 7: coronal soft · coronal · 0.38mm/px · 3 of 83 slices shown]
[im 28/83  brain]
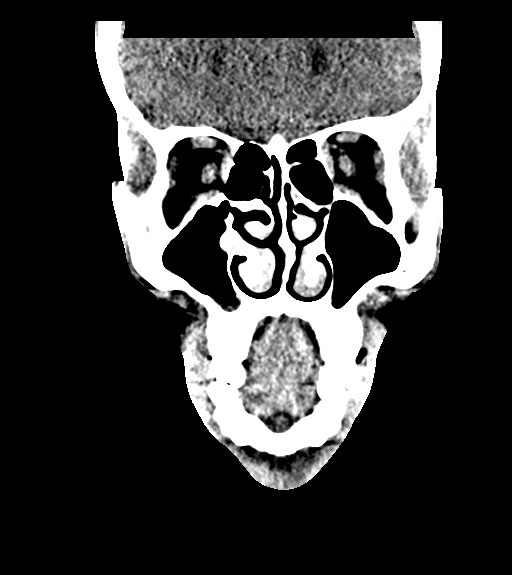
[im 37/83  brain]
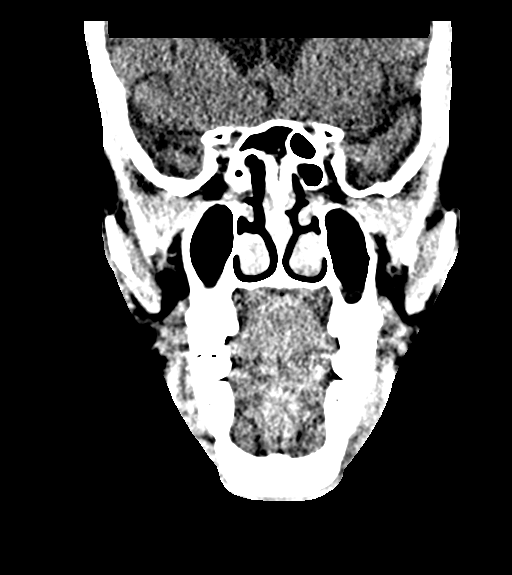
[im 46/83  brain]
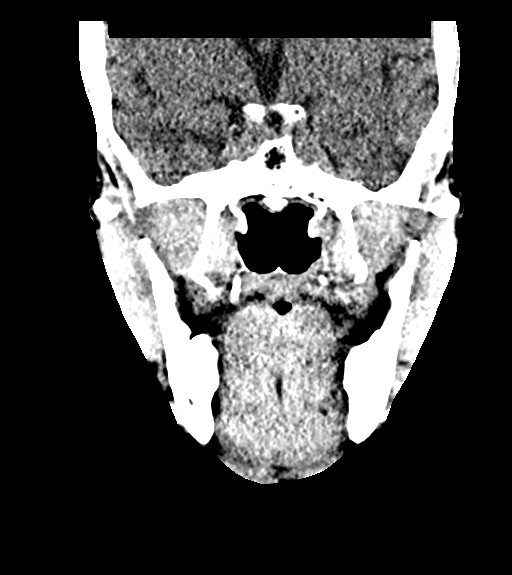

[Series 8: sagittal soft · sagittal · 0.33mm/px · 3 of 98 slices shown]
[im 33/98  brain]
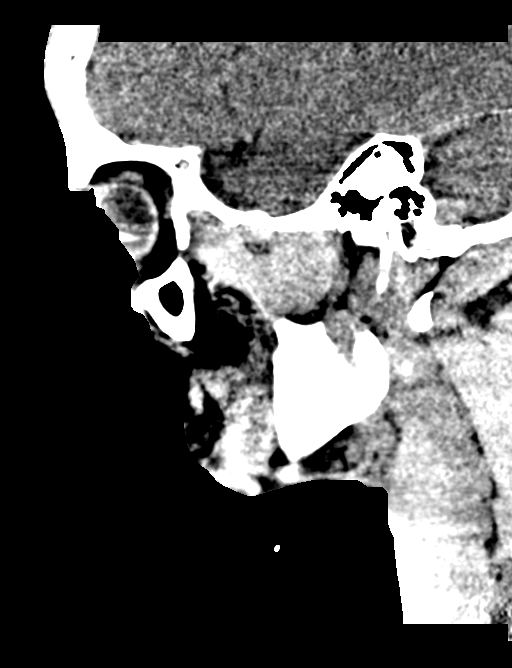
[im 49/98  brain]
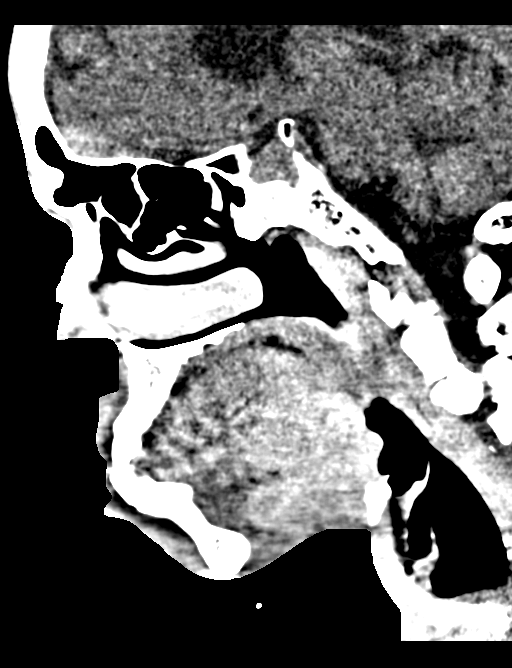
[im 65/98  brain]
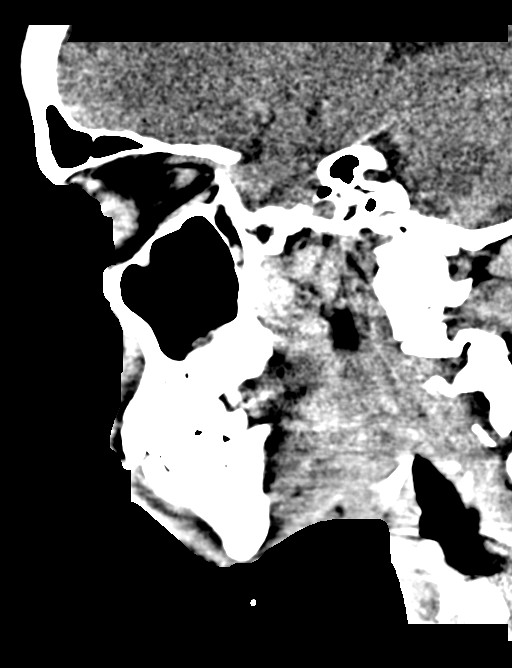

[16 of 47 positions shown; findings below may reference images not displayed]

FINDINGS: Again seen is the obliquely oriented parasymphyseal mandibular
fracture with involvement of the root of the left lateral incisor,
as described on the same-day maxillofacial CT.
IMPRESSION: Parasymphyseal mandibular fracture as described on same-day
maxillofacial CT.

## 2022-11-05 NOTE — Progress Notes (Signed)
Pt attended screening event on 09/26/22, where screening results were wnl. At the event, Pt did not share PCP info and indicated transportation insecurities. Pt was provided with transportation resources. Pt did not share address or phone number at the event. Pt was contacted by phone for PCP follow up using phone number listed in [facility].   11/09/22: Unable to contact Pt by phone x3 for PCP follow up. Due to this, a letter was mailed to Pt with PCP resources to an address that was listed in [facility].

## 2022-12-19 ENCOUNTER — Other Ambulatory Visit: Payer: Self-pay

## 2022-12-19 ENCOUNTER — Emergency Department (HOSPITAL_COMMUNITY)
Admission: EM | Admit: 2022-12-19 | Discharge: 2022-12-19 | Disposition: A | Discharge status: Home / Self Care | Payer: Self-pay | Attending: Emergency Medicine | Admitting: Emergency Medicine

## 2022-12-19 DIAGNOSIS — E860 Dehydration: Secondary | ICD-10-CM | POA: Insufficient documentation

## 2022-12-19 DIAGNOSIS — Z5900 Homelessness unspecified: Secondary | ICD-10-CM | POA: Insufficient documentation

## 2022-12-19 DIAGNOSIS — R1013 Epigastric pain: Secondary | ICD-10-CM | POA: Insufficient documentation

## 2022-12-19 LAB — CBC WITH DIFFERENTIAL/PLATELET
Abs Immature Granulocytes: 0.01 10*3/uL (ref 0.00–0.07)
Basophils Absolute: 0 10*3/uL (ref 0.0–0.1)
Basophils Relative: 0 %
Eosinophils Absolute: 0.2 10*3/uL (ref 0.0–0.5)
Eosinophils Relative: 3 %
HCT: 41.3 % (ref 39.0–52.0)
Hemoglobin: 13.4 g/dL (ref 13.0–17.0)
Immature Granulocytes: 0 %
Lymphocytes Relative: 39 %
Lymphs Abs: 1.9 10*3/uL (ref 0.7–4.0)
MCH: 28.5 pg (ref 26.0–34.0)
MCHC: 32.4 g/dL (ref 30.0–36.0)
MCV: 87.7 fL (ref 80.0–100.0)
Monocytes Absolute: 0.4 10*3/uL (ref 0.1–1.0)
Monocytes Relative: 9 %
Neutro Abs: 2.4 10*3/uL (ref 1.7–7.7)
Neutrophils Relative %: 49 %
Platelets: 358 10*3/uL (ref 150–400)
RBC: 4.71 MIL/uL (ref 4.22–5.81)
RDW: 12.5 % (ref 11.5–15.5)
WBC: 4.8 10*3/uL (ref 4.0–10.5)
nRBC: 0 % (ref 0.0–0.2)

## 2022-12-19 LAB — COMPREHENSIVE METABOLIC PANEL
ALT: 72 U/L — ABNORMAL HIGH (ref 0–44)
AST: 59 U/L — ABNORMAL HIGH (ref 15–41)
Albumin: 4.1 g/dL (ref 3.5–5.0)
Alkaline Phosphatase: 57 U/L (ref 38–126)
Anion gap: 12 (ref 5–15)
BUN: 17 mg/dL (ref 6–20)
CO2: 25 mmol/L (ref 22–32)
Calcium: 9.4 mg/dL (ref 8.9–10.3)
Chloride: 97 mmol/L — ABNORMAL LOW (ref 98–111)
Creatinine, Ser: 1.31 mg/dL — ABNORMAL HIGH (ref 0.61–1.24)
GFR, Estimated: >60 mL/min (ref >60)
Glucose, Bld: 127 mg/dL — ABNORMAL HIGH (ref 70–99)
Potassium: 3.9 mmol/L (ref 3.5–5.1)
Sodium: 134 mmol/L — ABNORMAL LOW (ref 135–145)
Total Bilirubin: 0.7 mg/dL (ref 0.3–1.2)
Total Protein: 7.8 g/dL (ref 6.5–8.1)

## 2022-12-19 LAB — LIPASE, BLOOD: Lipase: 22 U/L (ref 11–51)

## 2022-12-19 LAB — TROPONIN I (HIGH SENSITIVITY): Troponin I (High Sensitivity): 3 ng/L (ref <18)

## 2022-12-19 MED ORDER — ALUM & MAG HYDROXIDE-SIMETH 200-200-20 MG/5ML PO SUSP
30.0000 mL | Freq: Once | ORAL | Status: AC
  Administered 2022-12-19: 30 mL via ORAL
  Filled 2022-12-19: qty 30

## 2022-12-19 MED ORDER — LIDOCAINE VISCOUS HCL 2 % MT SOLN
15.0000 mL | Freq: Once | OROMUCOSAL | Status: AC
  Administered 2022-12-19: 15 mL via ORAL
  Filled 2022-12-19: qty 15

## 2022-12-19 MED ORDER — LACTATED RINGERS IV BOLUS
1000.0000 mL | Freq: Once | INTRAVENOUS | Status: AC
Start: 2022-12-19 — End: 2022-12-19
  Administered 2022-12-19: 1000 mL via INTRAVENOUS

## 2022-12-19 MED ORDER — PANTOPRAZOLE SODIUM 40 MG PO TBEC
40.0000 mg | DELAYED_RELEASE_TABLET | Freq: Every day | ORAL | 0 refills | Status: AC
Start: 2022-12-19 — End: 2023-12-24
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
## 2022-12-19 NOTE — ED Notes (Signed)
Discharge instructions discussed with pt. Verbalized understanding. VSS. No questions or concerns regarding discharge  

## 2022-12-19 NOTE — Discharge Instructions (Addendum)
Your workup was reassuring today.  Please continue to keep hydrated and eat throughout the day.  I have sent in a prescription for pantoprazole (Protonix) to your pharmacy.  A condition called GERD may be contributing to your symptoms, you may take this medication daily for the next 4 weeks to help with acid reflux.  Return to the ER if you have chest pain, shortness of breath, dizziness, uncontrolled nausea or vomiting, fever or chills, any other new or concerning symptoms

## 2022-12-19 NOTE — ED Provider Notes (Signed)
[place] EMERGENCY DEPARTMENT AT [facility] Provider Note   CSN: [id number] Arrival date & time: 12/19/22  1715     History  Chief Complaint  Patient presents with   Nausea    Robert Duffy is a 42 y.o. male experiencing homelessness, no other medical history presents with concern for stomach pain that started after eating a packet of tuna today.  He ate the packet of tuna about 3 PM today and started noticing some discomfort in his stomach that went up into his throat.  Denies any chest pain, shortness of breath, nausea or vomiting, diarrhea, fever or chills.  Pain did not radiate elsewhere.  Symptoms have improved since arriving in the ER, but he still notes a "fuzziness" in his stomach.  He also feels like there are gases in the shed he was living in and is unsure if this may have contributed to his symptoms.  HPI     Home Medications Prior to Admission medications   Medication Sig Start Date End Date Taking? Authorizing Provider  pantoprazole (PROTONIX) 40 MG tablet Take 1 tablet (40 mg total) by mouth daily for 28 days. 12/19/22 01/16/23 Yes Arabella Merles, PA-C  famotidine (PEPCID) 20 MG tablet Take 1 tablet (20 mg total) by mouth 2 (two) times daily. Patient not taking: Reported on 09/05/2022 07/18/20   Renne Crigler, PA-C      Allergies    Patient has no known allergies.    Review of Systems   Review of Systems  Constitutional:  Negative for fever.  Gastrointestinal:  Positive for abdominal pain.    Physical Exam Updated Vital Signs BP 111/68   Pulse 73   Resp 13   Ht 6' (1.829 m)   Wt 83.9 kg   SpO2 98%   BMI 25.09 kg/m  Physical Exam Vitals and nursing note reviewed.  Constitutional:      General: He is not in acute distress.    Appearance: He is well-developed.  HENT:     Head: Normocephalic and atraumatic.     Mouth/Throat:     Mouth: Mucous membranes are dry.  Eyes:     Conjunctiva/sclera: Conjunctivae normal.  Cardiovascular:      Rate and Rhythm: Normal rate and regular rhythm.     Heart sounds: No murmur heard.    Comments: 2+ radial pulses bilaterally Pulmonary:     Effort: Pulmonary effort is normal. No respiratory distress.     Breath sounds: Normal breath sounds.  Abdominal:     General: Abdomen is flat. Bowel sounds are normal.     Palpations: Abdomen is soft.     Tenderness: There is no abdominal tenderness. There is no right CVA tenderness or left CVA tenderness.  Musculoskeletal:        General: No swelling.     Cervical back: Neck supple.  Skin:    General: Skin is warm and dry.     Capillary Refill: Capillary refill takes less than 2 seconds.  Neurological:     Mental Status: He is alert.  Psychiatric:        Mood and Affect: Mood normal.     ED Results / Procedures / Treatments   Labs (all labs ordered are listed, but only abnormal results are displayed) Labs Reviewed  COMPREHENSIVE METABOLIC PANEL - Abnormal; Notable for the following components:      Result Value   Sodium 134 (*)    Chloride 97 (*)    Glucose, Bld 127 (*)  Creatinine, Ser 1.31 (*)    AST 59 (*)    ALT 72 (*)    All other components within normal limits  CBC WITH DIFFERENTIAL/PLATELET  LIPASE, BLOOD  TROPONIN I (HIGH SENSITIVITY)    EKG EKG Interpretation Date/Time:  Saturday December 19 2022 17:55:43 EDT Ventricular Rate:  81 PR Interval:  137 QRS Duration:  94 QT Interval:  389 QTC Calculation: 452 R Axis:   55  Text Interpretation: Sinus rhythm RSR' in V1 or V2, probably normal variant No acute changes No significant change since last tracing Confirmed by Derwood Kaplan [phone]) on 12/19/2022 7:48:42 PM  Radiology No results found.  Procedures Procedures    Medications Ordered in ED Medications  lactated ringers bolus 1,000 mL (0 mLs Intravenous Stopped 12/19/22 1934)  alum & mag hydroxide-simeth (MAALOX/MYLANTA) 200-200-20 MG/5ML suspension 30 mL (30 mLs Oral Given 12/19/22 1914)    And   lidocaine (XYLOCAINE) 2 % viscous mouth solution 15 mL (15 mLs Oral Given 12/19/22 1914)    ED Course/ Medical Decision Making/ A&P                                 Medical Decision Making Amount and/or Complexity of Data Reviewed Labs: ordered.  Risk OTC drugs. Prescription drug management.   42 y.o. male experiencing homelessness presents to the ED for concern of stomach pain after eating tuna at 3 PM today  Differential diagnosis includes but is not limited to ACS, aortic dissection, GERD, gastroenteritis, pancreatitis  ED Course:  Patient presents with concern for an episode of stomach pain and discomfort that started at 3 PM after eating tuna. Upon initial evaluation, patient is well-appearing.  His vital signs are within normal limits except for a slightly soft blood pressure at 99/71.  He reports his symptoms have improved, but he still notes a "fuzziness" in his stomach.  Upon exam, he has regular rate and rhythm on cardiac auscultation, no adventitious lung sounds.  2+ radial pulses bilaterally.  He does not have any tenderness to palpation of the abdomen.  Given the description of his symptoms and no active chest pain or shortness of breath, hemodynamic stability, low suspicion for ACS or aortic dissection at this time. Further imaging such as chest x ray or CT angiography not indicated at this time. 1 L LR bolus given due to soft blood pressures and concern for dehydration as he is dry appearing on exam CBC with no leukocytosis, patient denying any fever and chills, diarrhea, do not suspect infectious etiology like gastroenteritis at this time.  Initial troponin is 3, EKG with normal sinus rhythm, suspicion for ACS.  Lipase within normal limits at 22, abdominal pain is improving, low suspicion for pancreatitis. Patient does have a slight elevation in his AST at 59, and ALT at 72.  However he does not have any elevation in his bilirubin, no left upper quadrant tenderness concerning  for cholelithiasis or cholangitis at this time. Patient given Maalox as they suspected his symptoms may be due to GERD.  Upon reevaluation, patient states his symptoms have resolved.  His blood pressure has improved to 111/60 after a 1 L bolus.  I feel he is appropriate for discharge at this time.  Impression: Epigastric abdominal pain Dehydration  Disposition:  The patient was discharged home with instructions to take full course of Protonix. Return precautions given.  Lab Tests: I Ordered, and personally interpreted labs.  The  pertinent results include:   CBC without leukocytosis CMP with elevated creatinine at 1.31, unknown what patient baseline is.  Slightly elevated AST at 59 and ALT at 72.  No elevation in bilirubin Lipase 22 Troponin at 3    Cardiac Monitoring: / EKG: The patient was maintained on a cardiac monitor.  I personally viewed and interpreted the cardiac monitored which showed an underlying rhythm of: Normal sinus rhythm   Social Determinants of Health:  unhoused             Final Clinical Impression(s) / ED Diagnoses Final diagnoses:  Epigastric pain  Dehydration    Rx / DC Orders ED Discharge Orders          Ordered    pantoprazole (PROTONIX) 40 MG tablet  Daily        12/19/22 2014              Arabella Merles, Cordelia Poche 12/19/22 2016    Derwood Kaplan, MD 12/20/22 1125

## 2023-01-12 ENCOUNTER — Encounter: Payer: Self-pay | Admitting: *Deleted

## 2023-01-12 NOTE — Progress Notes (Signed)
Pt attended 09/26/22 screening event where his b/p was 101/63 and his blood sugar was 107. At the event, the pt did not identify a PCP, note his insurance status and only noted a transportation insecurity for which he was given resources at the event. Pt also did not list a home address or a phone number. During the initial event f/u, the health equity team member tried to contact pt using previous phone number and address, was unable to reach pt by phone but did mail PCP info to his last known address. During this 60 day follow up attempt.chart review indicates that when pt visited ED on 12/19/22, he was unhoused and previous phone number in [facility] states calls unable to be completed at this time. Letter was mailed once more to try to share housing and food resources, as well as PCP and insurance options. Additional f/u to be scheduled per health equity protocol.

## 2023-05-05 ENCOUNTER — Encounter: Payer: Self-pay | Admitting: *Deleted

## 2023-05-05 NOTE — Progress Notes (Signed)
 Pt attended 09/26/22 screening event where his b/p was 101/63, and his blood sugar was 107. At the event, the pt did not note a PCP name, did not share insurance info, an address or a phone number, did document that he smokes and did identify a transportation insecurity. During initial, 60 day, and this final 6 month event f/u attempt, pt's only EPIC visible phone number has not been a working number and letters have been sent to pt offering PCP and insurance and housing and transportation resources. Chart review does not indicate that pt has had any primary care encounter since the event or since the 60 day follow up attempts. Therefore, since previous letters have not been returned, final letter sent to last known address on record with repeat info- [facility] flyers, Medicaid, [employer], and [facility] financial assistance info, and housing and transportation resources, along with the  211 24/7 contact info for both healthcare and SDOH insecurity resource access info. No additional health equity team support scheduled at this time.

## 2023-08-06 ENCOUNTER — Encounter (HOSPITAL_COMMUNITY): Payer: Self-pay | Admitting: Emergency Medicine

## 2023-08-06 ENCOUNTER — Emergency Department (HOSPITAL_COMMUNITY)
Admission: EM | Admit: 2023-08-06 | Discharge: 2023-08-07 | Disposition: A | Discharge status: Home / Self Care | Attending: Emergency Medicine | Admitting: Emergency Medicine

## 2023-08-06 ENCOUNTER — Other Ambulatory Visit: Payer: Self-pay

## 2023-08-06 ENCOUNTER — Emergency Department (HOSPITAL_COMMUNITY)

## 2023-08-06 DIAGNOSIS — J3489 Other specified disorders of nose and nasal sinuses: Secondary | ICD-10-CM | POA: Diagnosis not present

## 2023-08-06 DIAGNOSIS — R059 Cough, unspecified: Secondary | ICD-10-CM | POA: Diagnosis not present

## 2023-08-06 DIAGNOSIS — F22 Delusional disorders: Secondary | ICD-10-CM | POA: Diagnosis present

## 2023-08-06 DIAGNOSIS — F411 Generalized anxiety disorder: Secondary | ICD-10-CM | POA: Insufficient documentation

## 2023-08-06 DIAGNOSIS — R0789 Other chest pain: Secondary | ICD-10-CM | POA: Insufficient documentation

## 2023-08-06 DIAGNOSIS — F19959 Other psychoactive substance use, unspecified with psychoactive substance-induced psychotic disorder, unspecified: Secondary | ICD-10-CM | POA: Insufficient documentation

## 2023-08-06 DIAGNOSIS — Z79899 Other long term (current) drug therapy: Secondary | ICD-10-CM | POA: Diagnosis not present

## 2023-08-06 LAB — CBC WITH DIFFERENTIAL/PLATELET
Abs Immature Granulocytes: 0.01 10*3/uL (ref 0.00–0.07)
Basophils Absolute: 0 10*3/uL (ref 0.0–0.1)
Basophils Relative: 0 %
Eosinophils Absolute: 0.3 10*3/uL (ref 0.0–0.5)
Eosinophils Relative: 5 %
HCT: 40.8 % (ref 39.0–52.0)
Hemoglobin: 13.4 g/dL (ref 13.0–17.0)
Immature Granulocytes: 0 %
Lymphocytes Relative: 39 %
Lymphs Abs: 2.3 10*3/uL (ref 0.7–4.0)
MCH: 28.7 pg (ref 26.0–34.0)
MCHC: 32.8 g/dL (ref 30.0–36.0)
MCV: 87.4 fL (ref 80.0–100.0)
Monocytes Absolute: 0.4 10*3/uL (ref 0.1–1.0)
Monocytes Relative: 7 %
Neutro Abs: 2.9 10*3/uL (ref 1.7–7.7)
Neutrophils Relative %: 49 %
Platelets: 338 10*3/uL (ref 150–400)
RBC: 4.67 MIL/uL (ref 4.22–5.81)
RDW: 12.4 % (ref 11.5–15.5)
WBC: 5.9 10*3/uL (ref 4.0–10.5)
nRBC: 0 % (ref 0.0–0.2)

## 2023-08-06 LAB — COMPREHENSIVE METABOLIC PANEL WITH GFR
ALT: 80 U/L — ABNORMAL HIGH (ref 0–44)
AST: 77 U/L — ABNORMAL HIGH (ref 15–41)
Albumin: 4 g/dL (ref 3.5–5.0)
Alkaline Phosphatase: 58 U/L (ref 38–126)
Anion gap: 10 (ref 5–15)
BUN: 17 mg/dL (ref 6–20)
CO2: 24 mmol/L (ref 22–32)
Calcium: 9 mg/dL (ref 8.9–10.3)
Chloride: 97 mmol/L — ABNORMAL LOW (ref 98–111)
Creatinine, Ser: 1.08 mg/dL (ref 0.61–1.24)
GFR, Estimated: >60 mL/min (ref >60)
Glucose, Bld: 174 mg/dL — ABNORMAL HIGH (ref 70–99)
Potassium: 3.9 mmol/L (ref 3.5–5.1)
Sodium: 131 mmol/L — ABNORMAL LOW (ref 135–145)
Total Bilirubin: 0.7 mg/dL (ref 0.0–1.2)
Total Protein: 8 g/dL (ref 6.5–8.1)

## 2023-08-06 LAB — RESP PANEL BY RT-PCR (RSV, FLU A&B, COVID)  RVPGX2
Influenza A by PCR: NEGATIVE
Influenza B by PCR: NEGATIVE
Resp Syncytial Virus by PCR: NEGATIVE
SARS Coronavirus 2 by RT PCR: NEGATIVE

## 2023-08-06 LAB — ETHANOL: Alcohol, Ethyl (B): <10 mg/dL (ref <10)

## 2023-08-06 MED ORDER — FAMOTIDINE 20 MG PO TABS
20.0000 mg | ORAL_TABLET | Freq: Every day | ORAL | Status: DC
  Administered 2023-08-07: 20 mg via ORAL
  Filled 2023-08-06: qty 1

## 2023-08-06 MED ORDER — NICOTINE 21 MG/24HR TD PT24
21.0000 mg | MEDICATED_PATCH | Freq: Every day | TRANSDERMAL | Status: DC
  Administered 2023-08-07: 21 mg via TRANSDERMAL
  Filled 2023-08-06: qty 1

## 2023-08-06 MED ORDER — ALUM & MAG HYDROXIDE-SIMETH 200-200-20 MG/5ML PO SUSP
30.0000 mL | Freq: Once | ORAL | Status: AC
  Administered 2023-08-06: 30 mL via ORAL
  Filled 2023-08-06: qty 30

## 2023-08-06 MED ORDER — ALBUTEROL SULFATE HFA 108 (90 BASE) MCG/ACT IN AERS
1.0000 | INHALATION_SPRAY | RESPIRATORY_TRACT | Status: DC | PRN
Start: 2023-08-06 — End: 2023-08-07
  Administered 2023-08-06: 2 via RESPIRATORY_TRACT
  Filled 2023-08-06: qty 6.7

## 2023-08-06 MED ORDER — AEROCHAMBER PLUS FLO-VU MEDIUM MISC
1.0000 | Freq: Once | Status: AC
  Administered 2023-08-06: 1
  Filled 2023-08-06: qty 1
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
## 2023-08-06 NOTE — ED Provider Notes (Signed)
 [place] [facility]ERGENCY DEPARTMENT AT [facility] Provider Note   CSN: [id number] Arrival date & time: 08/06/23  2029     History  Chief Complaint  Patient presents with   Paranoid    Robert Duffy is a 43 y.o. male.  Pt is a 43 yo male with pmhx significant for gerd and homelessness.  Pt said he's had some cough and some sinus problems.  EMS was called and said he was in the middle of the road pacing in circles.  He said he went in the road to meet EMS.  EMS mentions a hx of schizophrenia, but no hx of this on epic chart review. Pt denies hallucinations or si/hi.       Home Medications Prior to Admission medications   Medication Sig Start Date End Date Taking? Authorizing Provider  famotidine (PEPCID) 20 MG tablet Take 1 tablet (20 mg total) by mouth 2 (two) times daily. Patient not taking: Reported on 08/06/2023 07/18/20   Renne Crigler, PA-C  pantoprazole (PROTONIX) 40 MG tablet Take 1 tablet (40 mg total) by mouth daily for 28 days. Patient not taking: Reported on 08/06/2023 12/19/22 08/06/23  Arabella Merles, PA-C      Allergies    Other    Review of Systems   Review of Systems  Respiratory:  Positive for cough.   All other systems reviewed and are negative.   Physical Exam Updated Vital Signs BP 127/81   Pulse 71   Temp 98.5 F (36.9 C) (Oral)   Resp 18   SpO2 100%  Physical Exam Vitals and nursing note reviewed.  Constitutional:      Appearance: Normal appearance.  HENT:     Head: Normocephalic and atraumatic.     Comments: Multiple dental caries    Right Ear: External ear normal.     Left Ear: External ear normal.     Nose: Nose normal.     Mouth/Throat:     Mouth: Mucous membranes are moist.     Pharynx: Oropharynx is clear.  Eyes:     Extraocular Movements: Extraocular movements intact.     Conjunctiva/sclera: Conjunctivae normal.     Pupils: Pupils are equal, round, and reactive to light.  Cardiovascular:     Rate and Rhythm:  Normal rate and regular rhythm.     Pulses: Normal pulses.     Heart sounds: Normal heart sounds.  Pulmonary:     Effort: Pulmonary effort is normal.     Breath sounds: Normal breath sounds.  Abdominal:     General: Abdomen is flat. Bowel sounds are normal.     Palpations: Abdomen is soft.  Musculoskeletal:        General: Normal range of motion.     Cervical back: Normal range of motion and neck supple.  Skin:    General: Skin is warm.     Capillary Refill: Capillary refill takes less than 2 seconds.  Neurological:     General: No focal deficit present.     Mental Status: He is alert and oriented to person, place, and time.  Psychiatric:        Mood and Affect: Mood normal.        Behavior: Behavior normal.     ED Results / Procedures / Treatments   Labs (all labs ordered are listed, but only abnormal results are displayed) Labs Reviewed  COMPREHENSIVE METABOLIC PANEL WITH GFR - Abnormal; Notable for the following components:      Result Value  Sodium 131 (*)    Chloride 97 (*)    Glucose, Bld 174 (*)    AST 77 (*)    ALT 80 (*)    All other components within normal limits  RESP PANEL BY RT-PCR (RSV, FLU A&B, COVID)  [id number]  ETHANOL  CBC WITH DIFFERENTIAL/PLATELET  RAPID URINE DRUG SCREEN, HOSP PERFORMED  URINALYSIS, ROUTINE W REFLEX MICROSCOPIC    EKG None  Radiology DG Chest 2 View Result Date: 08/06/2023 CLINICAL DATA:  Cough EXAM: CHEST - 2 VIEW COMPARISON:  09/24/2022 FINDINGS: Stable mild elevation of the left hemidiaphragm. Gaseous distention of the stomach. Heart and mediastinal contours are within normal limits. No focal opacities or effusions. No acute bony abnormality. IMPRESSION: No active cardiopulmonary disease. Gaseous distention of the stomach with stable mild elevation of the left hemidiaphragm. Electronically Signed   By: Charlett Nose M.D.   On: 08/06/2023 22:20    Procedures Procedures    Medications Ordered in ED Medications  albuterol  (VENTOLIN HFA) 108 (90 Base) MCG/ACT inhaler 1-2 puff (2 puffs Inhalation Given 08/06/23 2131)  AeroChamber Plus Flo-Vu Medium MISC 1 each (1 each Other Given 08/06/23 2130)  alum & mag hydroxide-simeth (MAALOX/MYLANTA) 200-200-20 MG/5ML suspension 30 mL (30 mLs Oral Given 08/06/23 2252)    ED Course/ Medical Decision Making/ A&P                                 Medical Decision Making Amount and/or Complexity of Data Reviewed Labs: ordered. Radiology: ordered.  Risk OTC drugs. Prescription drug management.   This patient presents to the ED for concern of cough, this involves an extensive number of treatment options, and is a complaint that carries with it a high risk of complications and morbidity.  The differential diagnosis includes pna, bronchitis, covid/flu/rsv   Co morbidities that complicate the patient evaluation  gerd and homelessness   Additional history obtained:  Additional history obtained from epic chart review External records from outside source obtained and reviewed including EMS report   Lab Tests:  I Ordered, and personally interpreted labs.  The pertinent results include:  cbc nl, covid/flu/rsv neg, cmp nl other than bs elevated at 174, ast/alt sl elevated at 77 and 80, etoh neg   Imaging Studies ordered:  I ordered imaging studies including cxr  I independently visualized and interpreted imaging which showed  No active cardiopulmonary disease.    Gaseous distention of the stomach with stable mild elevation of the  left hemidiaphragm.   I agree with the radiologist interpretation   Cardiac Monitoring:  The patient was maintained on a cardiac monitor.  I personally viewed and interpreted the cardiac monitored which showed an underlying rhythm of: nsr   Medicines ordered and prescription drug management:  I ordered medication including gi cocktail/inhaler  for sx  Reevaluation of the patient after these medicines showed that the patient  improved I have reviewed the patients home medicines and have made adjustments as needed   Consultations Obtained:  I requested consultation with the TTS,  and discussed lab and imaging findings as well as pertinent plan - consult pending   Problem List / ED Course:  Atypical cp:  gastric distention on CXR, but he is eating a sandwich without any problem.  Pt said it's been awhile since he ate.  He is feeling better now.   Paranoia:  pt said he'd like to speak with TTS.  He  is voluntary.     Reevaluation:  After the interventions noted above, I reevaluated the patient and found that they have :improved   Social Determinants of Health:  Lives in an apartment, but it sounds like it's not good housing because he said it was wet.   Dispostion:  After consideration of the diagnostic results and the patients response to treatment, I feel that the patent would benefit from TTS consult.          Final Clinical Impression(s) / ED Diagnoses Final diagnoses:  Paranoia (HCC)  Atypical chest pain    Rx / DC Orders ED Discharge Orders     None         Jacalyn Lefevre, MD 08/06/23 2253

## 2023-08-06 NOTE — ED Notes (Signed)
 Pt attempted to provide urine specimen x1, unsuccessful.

## 2023-08-07 DIAGNOSIS — F411 Generalized anxiety disorder: Secondary | ICD-10-CM | POA: Insufficient documentation

## 2023-08-07 DIAGNOSIS — F19959 Other psychoactive substance use, unspecified with psychoactive substance-induced psychotic disorder, unspecified: Secondary | ICD-10-CM | POA: Insufficient documentation

## 2023-08-07 LAB — URINALYSIS, ROUTINE W REFLEX MICROSCOPIC
Bilirubin Urine: NEGATIVE
Glucose, UA: NEGATIVE mg/dL
Hgb urine dipstick: NEGATIVE
Ketones, ur: NEGATIVE mg/dL
Leukocytes,Ua: NEGATIVE
Nitrite: NEGATIVE
Protein, ur: NEGATIVE mg/dL
Specific Gravity, Urine: 1.024 (ref 1.005–1.030)
pH: 6 (ref 5.0–8.0)

## 2023-08-07 LAB — RAPID URINE DRUG SCREEN, HOSP PERFORMED
Amphetamines: POSITIVE — AB
Barbiturates: NOT DETECTED
Benzodiazepines: NOT DETECTED
Cocaine: POSITIVE — AB
Opiates: NOT DETECTED
Tetrahydrocannabinol: NOT DETECTED

## 2023-08-07 MED ORDER — HYDROXYZINE HCL 25 MG PO TABS: 25.0000 mg | ORAL_TABLET | Freq: Three times a day (TID) | ORAL | Status: DC | PRN

## 2023-08-07 NOTE — ED Notes (Signed)
 Pt calm and cooperative through shift. Pt presents as restless through the night. Pt did not sleep for long periods. Pt had multiple episodes of standing in his room and staring at the wall and then at the TV.

## 2023-08-07 NOTE — BH Assessment (Signed)
 Patient was deferred to [facility] for a telepsych assessment. The assigned care coordinator will provide updates regarding the scheduling of the assessment. [facility] coordinator can be reached at [phone] for further information on the timing of the telepsych evaluation.

## 2023-08-07 NOTE — ED Notes (Signed)
 Patients pastor Mr. Lewis phone number [phone] provided to provider.

## 2023-08-07 NOTE — Consult Note (Signed)
 [facility] Psychiatric Consult Initial  Patient Name: .Caidan Hubbert  MRN: [id number]  DOB: 08/04/1980  Consult Order details:  Orders (From admission, onward)     Start     Ordered   08/06/23 2254  CONSULT TO CALL ACT TEAM       Ordering Provider: Sueellen Emery, MD  Provider:  (Not yet assigned)  Question:  Reason for Consult?  Answer:  Psych consult   08/06/23 2253             Mode of Visit: In person    Psychiatry Consult Evaluation  Service Date: August 07, 2023 LOS:  LOS: 0 days  Chief Complaint "paranoia"   Primary Psychiatric Diagnoses  Psychoactive Substance Induced psychosis 2.   Anxiety  Assessment  Mario Streight is a 43 y.o. male admitted: Presented to the ED on 08/06/2023  8:36 PM for paranoid behavior. He carries the psychiatric diagnoses of Psychoactive Substance Induced psychosis and has a past medical history of GERD.    Katrina Parma, 42 y.o., male patient seen face to face by this provider, consulted with Dr. Deborah Falling; and chart reviewed on 08/07/23.  On evaluation Nevyn Kwiatek reports that he is here in the hospital due to paranoid behavior last night.  He endorses using cocaine and "meth" states that he does not use a lot of it, but when he does use, he does feel paranoid.  He currently denies paranoid behavior, denies suicidal and homicidal ideations.  He states he was not trying to harm herself or hurt himself in any way, states "I was just high."Patient states that he is currently living with a friend and the friend shed, states he was leaving his home and was wandering in the street accidentally.  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  She states that he has been using cocaine for 13 years, states he was abstinent from cocaine last year and began using methamphetamines, states he fell off the wagon and is currently indulging in both occasionally.  Patient denies  having any psychiatric diagnoses, states occasionally he feels sad, but states right now he is not having any sadness or anxiety.  Patient states he does not take any psychiatric medications, does not feel that he needs to be on any medications, states he is not using substances to self medicate, he states he enjoys using cocaine and methamphetamines, due to the way it makes him feel.  Patient denies ever being admitted to an inpatient psychiatric facility.  Per chart review patient does not have a big psychiatric history and chart, patient has been admitted to the emergency department twice for opiate overdose accidental. 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 [name].   During evaluation Johntavious Waln is sitting on the edge of his bed and appears to be in no acute distress. He is alert, oriented x 4, calm, cooperative and attentive. His mood is euthymic with congruent affect. He has normal speech, and appropriate behavior, patient does speak with his hands also and uses hands a lot as he is talking, patient states that is something that he has done to his whole life.  Objectively there is no evidence of psychosis/mania or delusional thinking.  Patient is able to converse coherently, goal directed thoughts, no distractibility, or pre-occupation. He denies suicidal/self-harm/homicidal ideation, psychosis, and paranoia.   Patient is not linked with outpatient psychiatry currently. Patient denies suicidal and homicidal ideation.  He easily contracts verbally for safety at this time.  He denies auditory and visual hallucinations.  There is no evidence of delusional thought content no indication that patient is responding to internal stimuli.    Patient staying with a friend in his shed.  He denies access to weapons.  He has applied for disability benefits.  Patient states he currently does not want help with substance abuse, at this time, but will accept substance abuse resources.  This provider also discussed with him if he felt he needed medication management or therapy he can go to behavioral health urgent care which is open 24 hours a day 7 days a week.  Patient is in agreement   Patient offered support and encouragement.   Patient and family are educated and verbalize understanding of mental health resources and other crisis services in the community. They are instructed to call 911 and present to the nearest emergency room should patient experience any suicidal/homicidal ideation, auditory/visual/hallucinations, or detrimental worsening of mental health condition.     Diagnoses:  Active Hospital problems: Principal Problem:   Psychoactive substance-induced psychosis (HCC) Active Problems:   Anxiety state    Plan   ## Psychiatric Medication Recommendations:  Nicorette patch for nicotine dependence Atarax 25 mg p.o. 3 times daily as needed for anxiety  ## Medical Decision Making Capacity: Not specifically addressed in this encounter  ## Further Work-up:  -- No further workup needed at this time EKG or UDS -- most recent EKG on 12/19/2023 had QtC of 451 -- Pertinent labwork reviewed earlier this admission includes: CBC, CMP, EKG, UDS   ## Disposition:-- There are no psychiatric contraindications to discharge at this time Patient is psychiatrically cleared. Patient case review and discussed with Dr. Hubert Madden, and patient does not meet inpatient criteria for inpatient psychiatric treatment. At time of discharge, patient denies SI, HI, AVH and can contract for safety. He demonstrated no overt evidence of psychosis or mania. Prior to discharge, he verbalized that they understood warning signs, triggers, and symptoms of worsening  mental health and how to access emergency mental health care if they felt it was needed. Patient was instructed to call 911 or return to the emergency room if they experienced any concerning symptoms after discharge. Patient voiced understanding and agreed to the above.  Patient given resources to follow up with behavioral health urgent care for therapy and medication management. Patient denies access to weapons. Safety planning completed.    ## Behavioral / Environmental: -To minimize splitting of staff, assign one staff person to communicate all information from the team when feasible. or Utilize compassion and acknowledge the patient's experiences while setting clear and realistic expectations for care.    ## Safety and Observation Level:  - Based on my clinical evaluation, I estimate the patient to be at no risk of self harm in the current setting. - At this time, we recommend  routine. This decision is based on my review of the chart including patient's history and current presentation, interview of the patient, mental status examination, and consideration of suicide risk including evaluating suicidal ideation, plan, intent, suicidal or self-harm behaviors, risk factors, and protective factors. This judgment is based on our ability to directly address  suicide risk, implement suicide prevention strategies, and develop a safety plan while the patient is in the clinical setting. Please contact our team if there is a concern that risk level has changed.  CSSR Risk Category:C-SSRS RISK CATEGORY: No Risk  Suicide Risk Assessment: Patient has following modifiable risk factors for suicide: recklessness, which we are addressing by recommending that patient follow up with behavioral or urgent care, also provided substance abuse resources. Patient has following non-modifiable or demographic risk factors for suicide: male gender Patient has the following protective factors against suicide: Supportive  friends and Cultural, spiritual, or religious beliefs that discourage suicide  Thank you for this consult request. Recommendations have been communicated to the primary team.  We will recommend that patient follow up with behavioral or urgent care, also provided substance abuse resources. at this time.   Chandra Come, PMHNP       History of Present Illness  Relevant Aspects of Hospital ED Course:  Admitted on 08/06/2023 for paranoid behavior   Patient Report:  Katrina Parma, 43 y.o., male patient seen face to face by this provider, consulted with Dr. Deborah Falling; and chart reviewed on 08/07/23.  On evaluation Croy Schwandt reports that he is here in the hospital due to paranoid behavior last night.  He endorses using cocaine and "meth" states that he does not use a lot of it, but when he does use, he does feel paranoid.  He currently denies paranoid behavior, denies suicidal and homicidal ideations.  He states he was not trying to harm herself or hurt himself in any way, states "I was just high."Patient states that he is currently living with a friend and the friend shed, states he was leaving his home and was wandering in the street accidentally.  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  She states that he has been using cocaine for 13 years, states he was abstinent from cocaine last year and began using methamphetamines, states he fell off the wagon and is currently indulging in both occasionally.  Patient denies having any psychiatric diagnoses, states occasionally he feels sad, but states right now he is not having any sadness or anxiety.  Patient states he does not take any psychiatric medications, does not feel that he needs to be on any medications, states he is not using substances to self medicate, he states he enjoys using cocaine and methamphetamines, due to the way it makes him feel.  Patient denies  ever being admitted to an inpatient psychiatric facility.  Per chart review patient does not have a big psychiatric history and chart, patient has been admitted to the emergency department twice for opiate overdose accidental. 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 [name].   During evaluation Zandyr Whalley is sitting on the edge of his bed and appears to be in no acute distress. He is alert, oriented x 4, calm, cooperative and attentive. His mood is euthymic with congruent affect. He has normal speech, and appropriate behavior, patient does speak with his hands also and uses hands a lot as he is talking, patient states that is something that he has done to  his whole life.  Objectively there is no evidence of psychosis/mania or delusional thinking.  Patient is able to converse coherently, goal directed thoughts, no distractibility, or pre-occupation. He denies suicidal/self-harm/homicidal ideation, psychosis, and paranoia.   Patient is not linked with outpatient psychiatry currently. Patient denies suicidal and homicidal ideation.  He easily contracts verbally for safety at this time.  He denies auditory and visual hallucinations.  There is no evidence of delusional thought content no indication that patient is responding to internal stimuli.   Patient staying with a friend in his shed.  He denies access to weapons.  He has applied for disability benefits.  Patient states he currently does not want help with substance abuse, at this time, but will accept substance abuse resources.  This provider also discussed with him if he felt he needed medication management or therapy he can go to behavioral health urgent care which is open 24 hours a day  7 days a week.  Patient is in agreement   Patient offered support and encouragement.   Patient and family are educated and verbalize understanding of mental health resources and other crisis services in the community. They are instructed to call 911 and present to the nearest emergency room should patient experience any suicidal/homicidal ideation, auditory/visual/hallucinations, or detrimental worsening of mental health condition.    Psych ROS:  Depression: Denies Anxiety:  Denies Mania (lifetime and current): Denies Psychosis: (lifetime and current): When abusing methamphetamines  Collateral information:  Contacted [name] [phone], patient's pastor, he stated that he has known patient for several years, states he knows that patient is trying to "get back on his feet ", after going through some things in life.  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  He does not feel that patient is a danger to himself or anyone else, feels that patient will be safe in the community, will be able to pick patient up, and will be with him for the rest of the day to help him get things that he may use for his home.  He states that he can pick patient up about 12 or 1230.   ROS   Psychiatric and Social History  Psychiatric History:  Information collected from patient  Prev Dx/Sx: Substance-induced psychosis, anxiety Current Psych Provider: None Home Meds (current): Denies Previous Med Trials: Denies Therapy: None  Prior Psych Hospitalization: Denies Prior Self Harm: Denies Prior Violence: Denies  Family Psych History: Denies Family Hx suicide: Denies  Social History:  Developmental Hx: deferred Educational Hx: Patient graduated high school Occupational Hx: Unemployed Legal Hx: Denies, past history Living Situation: Lives with a friend Spiritual Hx: Yes Access to weapons/lethal means:  Denies  Substance History Alcohol: Occasionally Type of alcohol Beer Last Drink last month Number of drinks per day occasionally History of alcohol withdrawal seizures denies History of DT's Denies Tobacco: Yes Illicit drugs: Yes Prescription drug abuse: Denies Rehab hx: Denies  Exam Findings  Physical Exam:  Vital Signs:  Temp:  [97.7 F (36.5 C)-98.5 F (36.9 C)] 97.7 F (36.5 C) (04/12 0936) Pulse Rate:  [71-78] 78 (04/12 0936) Resp:  [18] 18 (04/12 0936) BP: (127-155)/(80-81) 155/80 (04/12 0936) SpO2:  [100 %] 100 % (04/12 0936) Weight:  [81.6 kg] 81.6 kg (04/11 2335) Blood pressure (!) 155/80, pulse 78, temperature 97.7 F (36.5 C), temperature source Oral, resp. rate 18, height 6' (1.829 m), weight 81.6 kg, SpO2 100%. Body mass  index is 24.41 kg/m.  Physical Exam  Mental Status Exam: General Appearance: Casual  Orientation:  Full (Time, Place, and Person)  Memory:  Immediate;   Fair Remote;   Fair  Concentration:  Concentration: Good and Attention Span: Good  Recall:  Fair  Attention  Good  Eye Contact:  Good  Speech:  Clear and Coherent  Language:  Good  Volume:  Normal  Mood: Euthymic, cooperative  Affect:  Appropriate  Thought Process:  Coherent  Thought Content:  WDL  Suicidal Thoughts:  No  Homicidal Thoughts:  No  Judgement:  Fair  Insight:  Fair  Psychomotor Activity:  Normal  Akathisia:  N/A  Fund of Knowledge:  Fair      Assets:  Manufacturing systems engineer Desire for Improvement Housing Social Support  Cognition:  WNL  ADL's:  Intact  AIMS (if indicated):        Other History   These have been pulled in through the EMR, reviewed, and updated if appropriate.  Family History:  The patient's family history is not on file.  Medical History: History reviewed. No pertinent past medical history.  Surgical History: History reviewed. No pertinent surgical history.   Medications:   Current Facility-Administered Medications:    albuterol  (VENTOLIN HFA) 108 (90 Base) MCG/ACT inhaler 1-2 puff, 1-2 puff, Inhalation, Q4H PRN, Haviland, Julie, MD, 2 puff at 08/06/23 2131   famotidine (PEPCID) tablet 20 mg, 20 mg, Oral, Daily, Haviland, Julie, MD, 20 mg at 08/07/23 0931   nicotine (NICODERM CQ - dosed in mg/24 hours) patch 21 mg, 21 mg, Transdermal, Daily, Haviland, Julie, MD, 21 mg at 08/07/23 0931  Current Outpatient Medications:    famotidine (PEPCID) 20 MG tablet, Take 1 tablet (20 mg total) by mouth 2 (two) times daily. (Patient not taking: Reported on 08/06/2023), Disp: 30 tablet, Rfl: 0   pantoprazole (PROTONIX) 40 MG tablet, Take 1 tablet (40 mg total) by mouth daily for 28 days. (Patient not taking: Reported on 08/06/2023), Disp: 28 tablet, Rfl: 0  Allergies: Allergies  Allergen Reactions   Other Shortness Of Breath, Itching and Other (See Comments)    Cannot tolerate strong smells- Car exhaust, Clorox, etc... Itchy eyes, other allergic symptoms, etc..    Alonza Knisley MOTLEY-MANGRUM, PMHNP

## 2023-08-07 NOTE — ED Notes (Signed)
 ED Provider at bedside for discharge assessment.

## 2023-08-07 NOTE — ED Notes (Addendum)
 Pt in room moves bed around then will stop, pull his collar stand still and stare then he will go back to moving the bed around. Pt continues the cycle multiple times.

## 2023-08-07 NOTE — ED Notes (Signed)
 Breakfast tray was given.

## 2023-08-07 NOTE — ED Notes (Signed)
 Patient awake and alert at this time. Standing in room eating breakfast. This nurse entered room to give medication . Patient calm and cooperative. Denies pain. Patient took medication without difficulty. Patient consents to nicotine patch and patch was place on right arm. This nurse informed patient that psych provider will complete assessment with patient

## 2023-08-07 NOTE — ED Notes (Signed)
 Pt just wanted bed to face the tv

## 2023-08-07 NOTE — Discharge Instructions (Signed)
 Discharge recommendations:  Patient is to take medications as prescribed. Please see information for follow-up appointment with psychiatry and therapy. Please follow up with your primary care provider for all medical related needs.   Therapy: We recommend that patient participate in individual therapy to address mental health concerns.  Medications: The patient or guardian is to contact a medical professional and/or outpatient provider to address any new side effects that develop. The patient or guardian should update outpatient providers of any new medications and/or medication changes.   Atypical antipsychotics: If you are prescribed an atypical antipsychotic, it is recommended that your height, weight, BMI, blood pressure, fasting lipid panel, and fasting blood sugar be monitored by your outpatient providers.  Safety:  The patient should abstain from use of illicit substances/drugs and abuse of any medications. If symptoms worsen or do not continue to improve or if the patient becomes actively suicidal or homicidal then it is recommended that the patient return to the closest [facility] emergency department, the [facility], or call 911 for further evaluation and treatment. National Suicide Prevention Lifeline 1-800-SUICIDE or 754-816-4623.  About 988 988 offers 24/7 access to trained crisis counselors who can help people experiencing mental health-related distress. People can call or text 988 or chat 988lifeline.org for themselves or if they are worried about a loved one who may need crisis support.  Crisis Mobile: [facility]:                     [phone] (for crisis response 24 hours a day) [facility] Hotline:                                            754-545-2447    Safety Plan Robert [facility] will reach out to his pastor Robert [facility], call 911 or call mobile crisis, or go to nearest emergency room if condition worsens or if  suicidal thoughts become active Patients' will follow up with Behavioral Health Urgent Care for outpatient psychiatric services (therapy/medication management).  The suicide prevention education provided includes the following: Suicide risk factors Suicide prevention and interventions National Suicide Hotline telephone number [facility] assessment telephone number [facility] Emergency Assistance 911 [facility] and/or Residential Mobile Crisis Unit telephone number Request made of family/significant other to:   pastor [facility] Remove weapons (e.g., guns, rifles, knives), all items previously/currently identified as safety concern.   Remove drugs/medications (over the counter, prescriptions, illicit drugs), all items previously/currently identified as a safety concern.    Substance Abuse Treatment Programs   Intensive Outpatient Programs [facility]                                   [address]                                                        [place], [place]  [phone]                                                     [facility] [address] [place] [address] [place], [place] [phone]   [facility]                             (Inpatient and outpatient)                                             [address]                                                                                                                [phone]                 [facility] [phone] (Suboxone and Methadone)   [address]                                                           [place], [place] [address]                                       [phone]                                                     [address] [place], [place] [phone]   [name] (Outpatient/Inpatient,  Chemical)                    (insurance only) [phone]  [facility] (Groups & Residential) [place], [place] [phone]                            [name]                                        [address]                                         [place], [place]                                                           [phone]                                                     [name] (for caregivers and family) [phone] [address] Amy [facility]. [address] [place], [place] [phone] Outpatient Psychiatry and Counseling   [facility]: Mobile Crisis Management 24 hours:  [phone]   [facility] [employer] sliding scale fee and walk in schedule: M-F 8am-12pm/1pm-3pm [address]  [place], [place] [address] [phone]   [facility] [address] [place], [place] [address] [phone]   [facility] ([facility] new patient walk-in appointments available Monday - Friday 8am -3pm.          [address] [place], [place] [address] [phone] or crisis line- [phone]   [facility]/ Intensive Outpatient Therapy Program [address] [place], [place] [address] [phone]   [facility]                                                 Crisis Services                                                             [phone] [address]                                      [place], [place] [address]  [name]   [facility] [phone]. [address] [place], [place] [address]     [name]                                                                                                              [address] Robert [facility]  [place], [place] [address]                                                           [phone]   [name] [address] [place], [place] [address] [phone]   [facility]                                   [address]                            [place], [place] [address]                                               [phone]                                                     Larance Plater, MD                                      [address]                                                [place] [address]                                                [phone]                                         [facility] [address] [place] [address]   [facility]  [address]                                      [place], [place]                                                [phone]                                                     [facility] [place] Tommy Frames, MD [address] [place], [place] [address] [phone]   [facility]                                               [address]                                                [place], [place] [address]                                               [phone]                                                     [facility] [address] [place], [place] [address] [phone] Resources for Temporary Residential Assistance/Crisis Centers   [facility]) M-F 8am-3pm   [address]  [place], [place] [address]   [phone] Services include: laundry, barbering, support groups, case management, phone  & computer access, showers, AA/NA mtgs, mental health/substance abuse  nurse, job skills class, disability information, VA assistance, spiritual classes, etc.

## 2023-08-07 NOTE — ED Notes (Signed)
Pt given cup of water per request.

## 2023-08-07 NOTE — ED Provider Notes (Addendum)
 Emergency Medicine Observation Re-evaluation Note  Robert [facility] is a 43 y.o. male, seen on rounds today.  Pt initially presented to the ED for complaints of Paranoid Currently, the patient is asleep in bed without distress.  Physical Exam  BP 127/81   Pulse 71   Temp 98.5 F (36.9 C) (Oral)   Resp 18   Ht 6' (1.829 m)   Wt 81.6 kg   SpO2 100%   BMI 24.41 kg/m  Physical Exam General: Resting in bed without agitation Cardiac: Not tachycardic on last vitals Lungs: Symmetric rise and fall of chest without acute respiratory distress Psych: No agitation at this time  ED Course / MDM  EKG:   I have reviewed the labs performed to date as well as medications administered while in observation.  Recent changes in the last 24 hours include none reported by overnight nursing.  Plan  Current plan is for awaiting TTS evaluation.    Robert [facility], Robert Sia, MD 08/07/23 [phone]  12:41 PM Psychiatry reports patient is safe for discharge home now and is psychiatrically cleared.  I assessed patient he was eating lunch and had no complaints.  Will discharge for outpatient follow-up.   Robert [facility], Robert Sia, MD 08/07/23 1241

## 2023-08-09 ENCOUNTER — Emergency Department (HOSPITAL_COMMUNITY)
Admission: EM | Admit: 2023-08-09 | Discharge: 2023-08-10 | Disposition: A | Discharge status: Home / Self Care | Attending: Emergency Medicine | Admitting: Emergency Medicine

## 2023-08-09 DIAGNOSIS — E860 Dehydration: Secondary | ICD-10-CM | POA: Diagnosis not present

## 2023-08-09 DIAGNOSIS — R0789 Other chest pain: Secondary | ICD-10-CM | POA: Diagnosis present

## 2023-08-09 NOTE — ED Triage Notes (Signed)
 Patient pressure low per EMS. 118/76, then 110 palpated.  Triage BP 87/64
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
## 2023-08-10 ENCOUNTER — Emergency Department (HOSPITAL_COMMUNITY)

## 2023-08-10 LAB — COMPREHENSIVE METABOLIC PANEL WITH GFR
ALT: 74 U/L — ABNORMAL HIGH (ref 0–44)
AST: 58 U/L — ABNORMAL HIGH (ref 15–41)
Albumin: 3.7 g/dL (ref 3.5–5.0)
Alkaline Phosphatase: 50 U/L (ref 38–126)
Anion gap: 10 (ref 5–15)
BUN: 28 mg/dL — ABNORMAL HIGH (ref 6–20)
CO2: 25 mmol/L (ref 22–32)
Calcium: 9.4 mg/dL (ref 8.9–10.3)
Chloride: 101 mmol/L (ref 98–111)
Creatinine, Ser: 0.82 mg/dL (ref 0.61–1.24)
GFR, Estimated: >60 mL/min (ref >60)
Glucose, Bld: 101 mg/dL — ABNORMAL HIGH (ref 70–99)
Potassium: 4 mmol/L (ref 3.5–5.1)
Sodium: 136 mmol/L (ref 135–145)
Total Bilirubin: 0.3 mg/dL (ref 0.0–1.2)
Total Protein: 7.3 g/dL (ref 6.5–8.1)

## 2023-08-10 LAB — CBC WITH DIFFERENTIAL/PLATELET
Abs Immature Granulocytes: 0.01 10*3/uL (ref 0.00–0.07)
Basophils Absolute: 0 10*3/uL (ref 0.0–0.1)
Basophils Relative: 0 %
Eosinophils Absolute: 0.4 10*3/uL (ref 0.0–0.5)
Eosinophils Relative: 6 %
HCT: 36 % — ABNORMAL LOW (ref 39.0–52.0)
Hemoglobin: 11.4 g/dL — ABNORMAL LOW (ref 13.0–17.0)
Immature Granulocytes: 0 %
Lymphocytes Relative: 43 %
Lymphs Abs: 3 10*3/uL (ref 0.7–4.0)
MCH: 27.9 pg (ref 26.0–34.0)
MCHC: 31.7 g/dL (ref 30.0–36.0)
MCV: 88.2 fL (ref 80.0–100.0)
Monocytes Absolute: 0.6 10*3/uL (ref 0.1–1.0)
Monocytes Relative: 8 %
Neutro Abs: 3 10*3/uL (ref 1.7–7.7)
Neutrophils Relative %: 43 %
Platelets: 310 10*3/uL (ref 150–400)
RBC: 4.08 MIL/uL — ABNORMAL LOW (ref 4.22–5.81)
RDW: 12.6 % (ref 11.5–15.5)
WBC: 7 10*3/uL (ref 4.0–10.5)
nRBC: 0 % (ref 0.0–0.2)

## 2023-08-10 LAB — TROPONIN I (HIGH SENSITIVITY): Troponin I (High Sensitivity): <2 ng/L (ref <18)

## 2023-08-10 LAB — ETHANOL: Alcohol, Ethyl (B): <10 mg/dL (ref <10)

## 2023-08-10 LAB — D-DIMER, QUANTITATIVE: D-Dimer, Quant: <0.27 ug{FEU}/mL (ref 0.00–0.50)

## 2023-08-10 MED ORDER — SODIUM CHLORIDE 0.9 % IV BOLUS
1000.0000 mL | Freq: Once | INTRAVENOUS | Status: AC
  Administered 2023-08-10: 1000 mL via INTRAVENOUS

## 2023-08-10 NOTE — ED Provider Notes (Signed)
 [place] EMERGENCY DEPARTMENT AT [facility] Provider Note   CSN: [id number] Arrival date & time: 08/09/23  2137     History  No chief complaint on file.   Robert Duffy is a 43 y.o. male.  The history is provided by the patient.  Robert Duffy is a 43 y.o. male who presents to the Emergency Department complaining of chest discomfort.  He presents to the emergency department for evaluation of an episode that occurred earlier today around 7 PM where he was uncomfortable in his chest and felt slightly short of breath and felt like it was hard to breathe deeply.  He does report drinking some alcohol earlier in the day and also had tea and coffee and thinks he might be dehydrated.  He did feel dizzy when this episode occurred.  At time of ED assessment he is feeling better without any chest discomfort or dizziness.  No associated fever, abdominal pain, nausea, vomiting, SI, HI.      Home Medications Prior to Admission medications   Medication Sig Start Date End Date Taking? Authorizing Provider  famotidine (PEPCID) 20 MG tablet Take 1 tablet (20 mg total) by mouth 2 (two) times daily. Patient not taking: Reported on 08/06/2023 07/18/20   Renne Crigler, PA-C  pantoprazole (PROTONIX) 40 MG tablet Take 1 tablet (40 mg total) by mouth daily for 28 days. Patient not taking: Reported on 08/06/2023 12/19/22 08/06/23  Arabella Merles, PA-C      Allergies    Other    Review of Systems   Review of Systems  All other systems reviewed and are negative.   Physical Exam Updated Vital Signs BP 113/73 (BP Location: Right Arm)   Pulse 71   Temp 97.8 F (36.6 C) (Oral)   Resp 17   SpO2 97%  Physical Exam Vitals and nursing note reviewed.  Constitutional:      Appearance: He is well-developed.  HENT:     Head: Normocephalic and atraumatic.  Cardiovascular:     Rate and Rhythm: Normal rate and regular rhythm.     Heart sounds: No murmur heard. Pulmonary:     Effort:  Pulmonary effort is normal. No respiratory distress.     Breath sounds: Normal breath sounds.  Abdominal:     Palpations: Abdomen is soft.     Tenderness: There is no abdominal tenderness. There is no guarding or rebound.  Musculoskeletal:        General: No swelling or tenderness.  Skin:    General: Skin is warm and dry.  Neurological:     Mental Status: He is alert and oriented to person, place, and time.  Psychiatric:        Behavior: Behavior normal.     ED Results / Procedures / Treatments   Labs (all labs ordered are listed, but only abnormal results are displayed) Labs Reviewed  COMPREHENSIVE METABOLIC PANEL WITH GFR - Abnormal; Notable for the following components:      Result Value   Glucose, Bld 101 (*)    BUN 28 (*)    AST 58 (*)    ALT 74 (*)    All other components within normal limits  CBC WITH DIFFERENTIAL/PLATELET - Abnormal; Notable for the following components:   RBC 4.08 (*)    Hemoglobin 11.4 (*)    HCT 36.0 (*)    All other components within normal limits  ETHANOL  D-DIMER, QUANTITATIVE  TROPONIN I (HIGH SENSITIVITY)    EKG EKG Interpretation Date/Time:  Tuesday August 10 2023 02:19:00 EDT Ventricular Rate:  65 PR Interval:  148 QRS Duration:  94 QT Interval:  442 QTC Calculation: 459 R Axis:   32  Text Interpretation: Normal sinus rhythm Normal ECG Confirmed by Kelsey Patricia [phone]) on 08/10/2023 2:26:25 AM  Radiology DG Chest 2 View Result Date: 08/10/2023 CLINICAL DATA:  Dyspnea EXAM: CHEST - 2 VIEW COMPARISON:  08/06/2023 FINDINGS: Stable gaseous distension of the stomach with elevation of left hemidiaphragm. Lungs are otherwise clear. No pneumothorax or pleural effusion. Cardiac size within limits. Pulmonary vascularity is normal. No acute bone abnormality. IMPRESSION: 1. No active cardiopulmonary disease. 2. Stable gaseous distension of the stomach. Electronically Signed   By: Worthy Heads M.D.   On: 08/10/2023 02:05     Procedures Procedures    Medications Ordered in ED Medications  sodium chloride 0.9 % bolus 1,000 mL (1,000 mLs Intravenous Bolus 08/10/23 0150)    ED Course/ Medical Decision Making/ A&P                                 Medical Decision Making Amount and/or Complexity of Data Reviewed Labs: ordered. Radiology: ordered.   Patient with history of crack cocaine use, methamphetamine use here for evaluation of episode of chest discomfort.  His symptoms have resolved at time of ED evaluation.  He does report feeling like he might be dehydrated due to drinking coffee and tea.  No drug use in the last 48 hours.  No IV drug use.  EKG is nonischemic and troponin is negative.  BUN is mildly elevated, concern for some degree of dehydration.  He did have borderline to low blood pressures early in his ED stay.  This did resolve with rest and IV fluids.  He is low risk for PE, D-dimer is negative.  Current clinical picture is not consistent with PE, ACS, dissection, pneumonia.  Patient is not suicidal, homicidal or acutely psychotic.  Given that he is feeling better feel he is stable for discharge with outpatient resources and return precautions.        Final Clinical Impression(s) / ED Diagnoses Final diagnoses:  Dehydration  Chest discomfort    Rx / DC Orders ED Discharge Orders     None         Kelsey Patricia, MD 08/10/23 [phone]

## 2023-08-10 NOTE — ED Notes (Signed)
 Provider at bedside

## 2023-08-21 ENCOUNTER — Other Ambulatory Visit: Payer: Self-pay

## 2023-08-21 ENCOUNTER — Encounter (HOSPITAL_COMMUNITY): Payer: Self-pay | Admitting: Emergency Medicine

## 2023-08-21 ENCOUNTER — Emergency Department (HOSPITAL_COMMUNITY)
Admission: EM | Admit: 2023-08-21 | Discharge: 2023-08-21 | Disposition: A | Discharge status: Home / Self Care | Attending: Emergency Medicine | Admitting: Emergency Medicine

## 2023-08-21 DIAGNOSIS — Z79899 Other long term (current) drug therapy: Secondary | ICD-10-CM | POA: Diagnosis not present

## 2023-08-21 DIAGNOSIS — L0201 Cutaneous abscess of face: Secondary | ICD-10-CM | POA: Diagnosis present

## 2023-08-21 DIAGNOSIS — I10 Essential (primary) hypertension: Secondary | ICD-10-CM | POA: Diagnosis not present

## 2023-08-21 DIAGNOSIS — L0291 Cutaneous abscess, unspecified: Secondary | ICD-10-CM

## 2023-08-21 MED ORDER — DOXYCYCLINE HYCLATE 100 MG PO CAPS: 100.0000 mg | ORAL_CAPSULE | Freq: Two times a day (BID) | ORAL | 0 refills | Status: DC

## 2023-08-21 MED ORDER — DOXYCYCLINE HYCLATE 100 MG PO TABS
100.0000 mg | ORAL_TABLET | Freq: Once | ORAL | Status: AC
  Administered 2023-08-21: 100 mg via ORAL
  Filled 2023-08-21: qty 1

## 2023-08-21 NOTE — ED Notes (Signed)
Pt given bus pass ?

## 2023-08-21 NOTE — Discharge Instructions (Addendum)
 Thank you for letting us  evaluate you today.  I do not see an area of fluctuance that I am able to drain at this time.  We have given you a dose of doxycycline here in the emergency department.  Have also provided you with a prescription sent to your pharmacy.  Please take all antibiotic medication as prescribed and do not miss a dose even if you are starting feel better.  Please follow-up with PCP, urgent care, ED within the next 2-3 days for wound recheck

## 2023-08-21 NOTE — ED Triage Notes (Signed)
  Patient BIB EMS for abscess to L lower face that has been there for about a week.  Patient states it started as ingrown hair and has progressed this week.  Denies any fevers but states it feels warm around the area.  Denies any pain at this time.    Patient states he took SL crystal meth earlier today.  No OTC meds today.

## 2023-08-21 NOTE — ED Provider Notes (Signed)
 [place] EMERGENCY DEPARTMENT AT [facility] Provider Note   CSN: [id number] Arrival date & time: 08/21/23  2021     History  Chief Complaint  Patient presents with   Abscess    Robert [facility] is a 43 y.o. male with past medical history of polysubstance abuse presents to the emergency department for evaluation of abscess to left lower face that he noticed 4 days ago.  He reports he believes it started as an ingrown hair and progressively has worsened.  Took sublingual crystal meth earlier today.  Denies fevers at home, injection into area where abscess is located.   Abscess Associated symptoms: no fatigue, no fever, no headaches, no nausea and no vomiting        Home Medications Prior to Admission medications   Medication Sig Start Date End Date Taking? Authorizing Provider  doxycycline (VIBRAMYCIN) 100 MG capsule Take 1 capsule (100 mg total) by mouth 2 (two) times daily. 08/21/23  Yes Royann Cords, PA  famotidine  (PEPCID ) 20 MG tablet Take 1 tablet (20 mg total) by mouth 2 (two) times daily. Patient not taking: Reported on 08/06/2023 07/18/20   Geiple, Joshua, PA-C  pantoprazole  (PROTONIX ) 40 MG tablet Take 1 tablet (40 mg total) by mouth daily for 28 days. Patient not taking: Reported on 08/06/2023 12/19/22 08/06/23  Rexie Catena, PA-C      Allergies    Other    Review of Systems   Review of Systems  Constitutional:  Negative for chills, fatigue and fever.  Respiratory:  Negative for cough, chest tightness, shortness of breath and wheezing.   Cardiovascular:  Negative for chest pain and palpitations.  Gastrointestinal:  Negative for abdominal pain, constipation, diarrhea, nausea and vomiting.  Neurological:  Negative for dizziness, seizures, weakness, light-headedness, numbness and headaches.    Physical Exam Updated Vital Signs BP (!) 142/102   Pulse (!) 107   Temp 98.1 F (36.7 C) (Oral)   Resp 20   Ht 6' (1.829 m)   Wt 81.6 kg   SpO2 100%    BMI 24.41 kg/m  Physical Exam Vitals and nursing note reviewed.  Constitutional:      General: He is not in acute distress.    Appearance: Normal appearance.  HENT:     Head: Normocephalic and atraumatic.   Eyes:     Conjunctiva/sclera: Conjunctivae normal.  Cardiovascular:     Rate and Rhythm: Normal rate.  Pulmonary:     Effort: Pulmonary effort is normal. No respiratory distress.     Breath sounds: Normal breath sounds.  Chest:     Chest wall: No tenderness.  Skin:    Capillary Refill: Capillary refill takes less than 2 seconds.     Coloration: Skin is not jaundiced or pale.  Neurological:     Mental Status: He is alert and oriented to person, place, and time. Mental status is at baseline.     ED Results / Procedures / Treatments   Labs (all labs ordered are listed, but only abnormal results are displayed) Labs Reviewed - No data to display  EKG None  Radiology No results found.  Procedures .Ultrasound ED Soft Tissue  Date/Time: 08/21/2023 10:12 PM  Performed by: Royann Cords, PA Authorized by: Royann Cords, PA   Procedure details:    Indications: localization of abscess     Images: archived   Location:    Location: face     Side:  Left Findings:     no abscess present  no foreign body present Comments:     No significant drainable abscess noted on US      Medications Ordered in ED Medications  doxycycline (VIBRA-TABS) tablet 100 mg (has no administration in time range)    ED Course/ Medical Decision Making/ A&P                                 Medical Decision Making Risk Prescription drug management.   Patient presents to the ED for concern of abscess on face, this involves an extensive number of treatment options, and is a complaint that carries with it a high risk of complications and morbidity.  The differential diagnosis includes abscess, cellulitis   Co morbidities that complicate the patient evaluation  Polysubstance  abuse   Additional history obtained:  Additional history obtained from Nursing   External records from outside source obtained and reviewed including triage RN note    Medicines ordered and prescription drug management:  I ordered medication including doxy  for abscess  Reevaluation of the patient after these medicines showed that the patient improved I have reviewed the patients home medicines and have made adjustments as needed     Problem List / ED Course:  Abscess Mildly TTP. No obvious fluctuance on exam. No obvious drainable abscess found on US  VS notable for mild HTN. No fever nor tachycardia Provided one dose of doxy for MRSA coverage in ED and provided prescription Informed patient to return to 2-3 days for wound recheck Discussed return to ED precautions   Reevaluation:  After the interventions noted above, I reevaluated the patient and found that they have :stayed the same    Dispostion:  After consideration of the diagnostic results and the patients response to treatment, I feel that the patent would benefit from outpatient management with abx.   Discussed ED workup, disposition, return to ED precautions with patient who expresses understanding agrees with plan.  All questions answered to their satisfaction.  They are agreeable to plan.  Discharge instructions provided on paperwork Final Clinical Impression(s) / ED Diagnoses Final diagnoses:  Abscess    Rx / DC Orders ED Discharge Orders          Ordered    doxycycline (VIBRAMYCIN) 100 MG capsule  2 times daily        08/21/23 2210              Royann Cords, PA 08/22/23 Nettie Barb    Wynetta Heckle, MD 08/22/23 [phone]

## 2023-08-24 ENCOUNTER — Encounter (HOSPITAL_COMMUNITY): Payer: Self-pay | Admitting: Emergency Medicine

## 2023-08-24 ENCOUNTER — Other Ambulatory Visit: Payer: Self-pay

## 2023-08-24 ENCOUNTER — Emergency Department (HOSPITAL_COMMUNITY)

## 2023-08-24 ENCOUNTER — Emergency Department (HOSPITAL_COMMUNITY)
Admission: EM | Admit: 2023-08-24 | Discharge: 2023-08-24 | Disposition: A | Discharge status: Home / Self Care | Attending: Emergency Medicine | Admitting: Emergency Medicine

## 2023-08-24 DIAGNOSIS — R221 Localized swelling, mass and lump, neck: Secondary | ICD-10-CM | POA: Insufficient documentation

## 2023-08-24 DIAGNOSIS — K047 Periapical abscess without sinus: Secondary | ICD-10-CM | POA: Insufficient documentation

## 2023-08-24 LAB — I-STAT CHEM 8, ED
BUN: 21 mg/dL — ABNORMAL HIGH (ref 6–20)
Calcium, Ion: 1.17 mmol/L (ref 1.15–1.40)
Chloride: 100 mmol/L (ref 98–111)
Creatinine, Ser: 0.9 mg/dL (ref 0.61–1.24)
Glucose, Bld: 134 mg/dL — ABNORMAL HIGH (ref 70–99)
HCT: 44 % (ref 39.0–52.0)
Hemoglobin: 15 g/dL (ref 13.0–17.0)
Potassium: 4.3 mmol/L (ref 3.5–5.1)
Sodium: 137 mmol/L (ref 135–145)
TCO2: 26 mmol/L (ref 22–32)

## 2023-08-24 MED ORDER — CLINDAMYCIN HCL 300 MG PO CAPS
300.0000 mg | ORAL_CAPSULE | Freq: Once | ORAL | Status: AC
  Administered 2023-08-24: 300 mg via ORAL
  Filled 2023-08-24: qty 1

## 2023-08-24 MED ORDER — ACETAMINOPHEN 500 MG PO TABS
1000.0000 mg | ORAL_TABLET | Freq: Once | ORAL | Status: AC
  Administered 2023-08-24: 1000 mg via ORAL
  Filled 2023-08-24: qty 2

## 2023-08-24 MED ORDER — CLINDAMYCIN HCL 300 MG PO CAPS
300.0000 mg | ORAL_CAPSULE | Freq: Three times a day (TID) | ORAL | 0 refills | Status: AC
Start: 2023-08-24 — End: 2023-08-29

## 2023-08-24 MED ORDER — IOHEXOL 300 MG/ML  SOLN
75.0000 mL | Freq: Once | INTRAMUSCULAR | Status: AC | PRN
  Administered 2023-08-24: 75 mL via INTRAVENOUS

## 2023-08-24 MED ORDER — OXYCODONE HCL 5 MG PO TABS
5.0000 mg | ORAL_TABLET | Freq: Once | ORAL | Status: AC
  Administered 2023-08-24: 5 mg via ORAL
  Filled 2023-08-24: qty 1

## 2023-08-24 NOTE — ED Provider Notes (Signed)
 Care of patient received from prior provider at 4:13 PM, please see their note for complete H/P and care plan.  Received handoff per ED course.  Clinical Course as of 08/24/23 1613  Tue Aug 24, 2023  1612 Stable  Facial swelling and mild abscess CT pending Possible malignancy due to duration  [CC]    Clinical Course User Index [CC] [name], MD    Reassessment: CT scan performed without focal pathology.  He does have a periapical abscess.  On oral exam, there is no clearly identifiable location of drainage or abscess incision at this time. Will start on clindamycin recommend follow-up with the dentistry.  He also has a skin lesion on his neck.  I recommend he follow-up with [facility] for outpatient management of skin lesion and further aggressive care management.  We discussed possibly malignancy and importance of close follow-up and patient expressed understanding.  Disposition:  I have considered need for hospitalization, however, considering all of the above, I believe this patient is stable for discharge at this time.  Patient/family educated about specific return precautions for given chief complaint and symptoms.  Patient/family educated about follow-up with PCP.     Patient/family expressed understanding of return precautions and need for follow-up. Patient spoken to regarding all imaging and laboratory results and appropriate follow up for these results. All education provided in verbal form with additional information in written form. Time was allowed for answering of patient questions. Patient discharged.    Emergency Department Medication Summary:   Medications  clindamycin (CLEOCIN) capsule 300 mg (has no administration in time range)  acetaminophen  (TYLENOL ) tablet 1,000 mg (has no administration in time range)  oxyCODONE  (Oxy IR/ROXICODONE ) immediate release tablet 5 mg (has no administration in time range)  iohexol  (OMNIPAQUE ) 300 MG/ML  solution 75 mL (75 mLs Intravenous Contrast Given 08/24/23 1724)            [name], MD 08/24/23 1948

## 2023-08-24 NOTE — ED Provider Notes (Signed)
 Robert Duffy   CSN: [id number] Arrival date & time: 08/24/23  1520     History  Chief Complaint  Patient presents with   Abcess follow up    Robert Duffy is a 43 y.o. male.  43 yo M with a chief complaint of a left-sided neck mass.  He states started and has been draining off and on.  He has been cautious to touch it.  He was recently seen in the emergency department today and told that there was nothing they could do to the drainage.  He thinks that it is extending now into his mouth and feels like it drains into his mouth at times.  No fevers.  No difficulty swallowing.        Home Medications Prior to Admission medications   Medication Sig Start Date End Date Taking? Authorizing Provider  doxycycline (VIBRAMYCIN) 100 MG capsule Take 1 capsule (100 mg total) by mouth 2 (two) times daily. 08/21/23   Royann Cords, PA  famotidine  (PEPCID ) 20 MG tablet Take 1 tablet (20 mg total) by mouth 2 (two) times daily. Patient not taking: Reported on 08/06/2023 07/18/20   Geiple, Joshua, PA-C  pantoprazole  (PROTONIX ) 40 MG tablet Take 1 tablet (40 mg total) by mouth daily for 28 days. Patient not taking: Reported on 08/06/2023 12/19/22 08/06/23  Rexie Catena, PA-C      Allergies    Other    Review of Systems   Review of Systems  Physical Exam Updated Vital Signs BP 100/76   Pulse 76   Temp 97.8 F (36.6 C)   Resp 16   SpO2 100%  Physical Exam Vitals and nursing Duffy reviewed.  Constitutional:      Appearance: He is well-developed.  HENT:     Head: Normocephalic and atraumatic.     Mouth/Throat:     Comments: Patient with a chronic appearing mass to the left side of the neck.  This is below the angle of the mandible.  I do not appreciate any obvious extension into the floor of the mouth.  He does have a left second molar that has been fractured at the gumline.  Poor dentition diffusely.  No sublingual swelling.   Tolerating secretions without difficulty. Eyes:     Pupils: Pupils are equal, round, and reactive to light.  Neck:     Vascular: No JVD.  Cardiovascular:     Rate and Rhythm: Normal rate and regular rhythm.     Heart sounds: No murmur heard.    No friction rub. No gallop.  Pulmonary:     Effort: No respiratory distress.     Breath sounds: No wheezing.  Abdominal:     General: There is no distension.     Tenderness: There is no abdominal tenderness. There is no guarding or rebound.  Musculoskeletal:        General: Normal range of motion.     Cervical back: Normal range of motion and neck supple.  Skin:    Coloration: Skin is not pale.     Findings: No rash.  Neurological:     Mental Status: He is alert and oriented to person, place, and time.  Psychiatric:        Behavior: Behavior normal.     ED Results / Procedures / Treatments   Labs (all labs ordered are listed, but only abnormal results are displayed) Labs Reviewed  I-STAT CHEM 8, ED - Abnormal; Notable for the  following components:      Result Value   BUN 21 (*)    Glucose, Bld 134 (*)    All other components within normal limits    EKG None  Radiology No results found.  Procedures Procedures    Medications Ordered in ED Medications - No data to display  ED Course/ Medical Decision Making/ A&P Clinical Course as of 08/24/23 1622  Tue Aug 24, 2023  1612 Stable  Facial swelling and mild abscess CT pending Possible malignancy due to duration  [CC]    Clinical Course User Index [CC] [name], MD                                 Medical Decision Making Amount and/or Complexity of Data Reviewed Radiology: ordered.   43 yo M with a chief complaints of mass to the left side of his neck.  He said this happened acutely.  He went to the ER and was told that it was a collection of infection but there was no drainable fluid collection and was discharged home.  My exam is not completely  consistent with an abscess.  I am concerned for possible neck mass.  Will obtain CT imaging.  Signed out to Dr. Urban Garden, please see their Duffy for further details of care in the ED.  The patients results and plan were reviewed and discussed.   Any x-rays performed were independently reviewed by myself.   Differential diagnosis were considered with the presenting HPI.  Medications - No data to display  Vitals:   08/24/23 1545  BP: 100/76  Pulse: 76  Resp: 16  Temp: 97.8 F (36.6 C)  SpO2: 100%    Final diagnoses:  Neck mass           Final Clinical Impression(s) / ED Diagnoses Final diagnoses:  Neck mass    Rx / DC Orders ED Discharge Orders     None         [name], DO 08/24/23 1622

## 2023-08-24 NOTE — ED Triage Notes (Signed)
 Patient report abscess follow up today. Patient stated he feels there is a hole in his mouth because of the abscess on his lower left neck. Patient denies fever. Patient denies N/V.

## 2023-09-24 ENCOUNTER — Other Ambulatory Visit: Payer: Self-pay

## 2023-09-24 ENCOUNTER — Encounter (HOSPITAL_COMMUNITY): Payer: Self-pay | Admitting: Emergency Medicine

## 2023-09-24 ENCOUNTER — Emergency Department (HOSPITAL_COMMUNITY)

## 2023-09-24 ENCOUNTER — Emergency Department (HOSPITAL_COMMUNITY)
Admission: EM | Admit: 2023-09-24 | Discharge: 2023-09-24 | Disposition: A | Discharge status: Home / Self Care | Attending: Emergency Medicine | Admitting: Emergency Medicine

## 2023-09-24 DIAGNOSIS — F17210 Nicotine dependence, cigarettes, uncomplicated: Secondary | ICD-10-CM | POA: Insufficient documentation

## 2023-09-24 DIAGNOSIS — R079 Chest pain, unspecified: Secondary | ICD-10-CM | POA: Diagnosis present

## 2023-09-24 DIAGNOSIS — R072 Precordial pain: Secondary | ICD-10-CM | POA: Insufficient documentation

## 2023-09-24 LAB — CBC
HCT: 39.6 % (ref 39.0–52.0)
Hemoglobin: 12.8 g/dL — ABNORMAL LOW (ref 13.0–17.0)
MCH: 28.4 pg (ref 26.0–34.0)
MCHC: 32.3 g/dL (ref 30.0–36.0)
MCV: 88 fL (ref 80.0–100.0)
Platelets: 320 10*3/uL (ref 150–400)
RBC: 4.5 MIL/uL (ref 4.22–5.81)
RDW: 12.5 % (ref 11.5–15.5)
WBC: 4.9 10*3/uL (ref 4.0–10.5)
nRBC: 0 % (ref 0.0–0.2)

## 2023-09-24 LAB — BASIC METABOLIC PANEL WITH GFR
Anion gap: 11 (ref 5–15)
BUN: 11 mg/dL (ref 6–20)
CO2: 24 mmol/L (ref 22–32)
Calcium: 9.5 mg/dL (ref 8.9–10.3)
Chloride: 99 mmol/L (ref 98–111)
Creatinine, Ser: 0.67 mg/dL (ref 0.61–1.24)
GFR, Estimated: >60 mL/min (ref >60)
Glucose, Bld: 117 mg/dL — ABNORMAL HIGH (ref 70–99)
Potassium: 3.6 mmol/L (ref 3.5–5.1)
Sodium: 134 mmol/L — ABNORMAL LOW (ref 135–145)

## 2023-09-24 LAB — TROPONIN I (HIGH SENSITIVITY): Troponin I (High Sensitivity): 3 ng/L (ref <18)

## 2023-09-24 MED ORDER — KETOROLAC TROMETHAMINE 15 MG/ML IJ SOLN
15.0000 mg | Freq: Once | INTRAMUSCULAR | Status: AC
  Administered 2023-09-24: 15 mg via INTRAMUSCULAR
  Filled 2023-09-24: qty 1

## 2023-09-24 NOTE — Discharge Instructions (Signed)
 You were seen in the emergency department today for chest pain.  As we discussed your lab work, EKG, chest x-ray all looked reassuring today.   I recommend monitoring your stress levels.  Continue to monitor how you are doing overall, and return to the emergency department for any new or worsening symptoms such as: Worsening pain or pain with exertion, difficulty breathing, sweating, or pain or swelling in your legs.  Call your PCP for follow-up.  You can take Tylenol /ibuprofen as needed for pain.  Return if development of any new or worsening symptoms.
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
## 2023-09-24 NOTE — ED Provider Triage Note (Signed)
 Emergency Medicine Provider Triage Evaluation Note  Robert Duffy , a 43 y.o. male  was evaluated in triage.  Pt complains of chest pain.  Reports chest pain began this evening.  Reports history of chest pain but states this episode began this evening.  Reports that the rain makes his chest pain worse.  Denies shortness of breath.  Denies lightheadedness or dizziness or weakness.  Denies history of cardiac events.  Review of Systems  Positive:  Negative:   Physical Exam  BP (!) 142/84 (BP Location: Right Arm)   Pulse 86   Temp 98.1 F (36.7 C) (Oral)   Resp 18   Ht 6' (1.829 m)   Wt 77.1 kg   SpO2 99%   BMI 23.06 kg/m  Gen:   Awake, no distress   Resp:  Normal effort  MSK:   Moves extremities without difficulty  Other:    Medical Decision Making  Medically screening exam initiated at 5:08 AM.  Appropriate orders placed.  Robert Duffy was informed that the remainder of the evaluation will be completed by another provider, this initial triage assessment does not replace that evaluation, and the importance of remaining in the ED until their evaluation is complete.     Robert Aden, PA-C 09/24/23 [phone]

## 2023-10-15 ENCOUNTER — Other Ambulatory Visit: Payer: Self-pay

## 2023-10-15 ENCOUNTER — Emergency Department (HOSPITAL_COMMUNITY)
Admission: EM | Admit: 2023-10-15 | Discharge: 2023-10-16 | Disposition: A | Discharge status: Home / Self Care | Attending: Emergency Medicine | Admitting: Emergency Medicine

## 2023-10-15 ENCOUNTER — Emergency Department (HOSPITAL_COMMUNITY)

## 2023-10-15 ENCOUNTER — Encounter (HOSPITAL_COMMUNITY): Payer: Self-pay

## 2023-10-15 DIAGNOSIS — Z720 Tobacco use: Secondary | ICD-10-CM | POA: Insufficient documentation

## 2023-10-15 DIAGNOSIS — R0602 Shortness of breath: Secondary | ICD-10-CM | POA: Insufficient documentation
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
## 2023-10-16 ENCOUNTER — Emergency Department (HOSPITAL_COMMUNITY)

## 2023-10-16 LAB — CBC
HCT: 37.8 % — ABNORMAL LOW (ref 39.0–52.0)
Hemoglobin: 12.4 g/dL — ABNORMAL LOW (ref 13.0–17.0)
MCH: 28.1 pg (ref 26.0–34.0)
MCHC: 32.8 g/dL (ref 30.0–36.0)
MCV: 85.7 fL (ref 80.0–100.0)
Platelets: 384 10*3/uL (ref 150–400)
RBC: 4.41 MIL/uL (ref 4.22–5.81)
RDW: 12 % (ref 11.5–15.5)
WBC: 4.4 10*3/uL (ref 4.0–10.5)
nRBC: 0 % (ref 0.0–0.2)

## 2023-10-16 LAB — BASIC METABOLIC PANEL WITH GFR
Anion gap: 9 (ref 5–15)
BUN: 17 mg/dL (ref 6–20)
CO2: 27 mmol/L (ref 22–32)
Calcium: 9.1 mg/dL (ref 8.9–10.3)
Chloride: 99 mmol/L (ref 98–111)
Creatinine, Ser: 0.84 mg/dL (ref 0.61–1.24)
GFR, Estimated: >60 mL/min (ref >60)
Glucose, Bld: 135 mg/dL — ABNORMAL HIGH (ref 70–99)
Potassium: 3.8 mmol/L (ref 3.5–5.1)
Sodium: 135 mmol/L (ref 135–145)

## 2023-10-16 MED ORDER — IOHEXOL 300 MG/ML  SOLN
100.0000 mL | Freq: Once | INTRAMUSCULAR | Status: AC | PRN
  Administered 2023-10-16: 100 mL via INTRAVENOUS

## 2023-10-16 NOTE — ED Provider Notes (Signed)
  Physical Exam  BP 106/76 (BP Location: Left Arm)   Pulse (!) 58   Temp 98.6 F (37 C) (Oral)   Resp 16   SpO2 100%   Physical Exam  Procedures  Procedures  ED Course / MDM    Medical Decision Making Amount and/or Complexity of Data Reviewed Labs: ordered. Radiology: ordered.  Risk Prescription drug management.   Pending CT chest for SOB  CT w/CM per radiology:  IMPRESSION: 1. No acute cardiopulmonary abnormalities. 2. No pulmonary nodules identified. 3. Moderate stool with gaseous distension of the colon within the imaged portions of the upper abdomen. Correlate for any clinical signs/symptoms of constipation.  VSS, no hypoxia. No increased WOB. He is felt stable for discharge.          Odell Balls, PA-C 10/16/23 0746    Trine Raynell Moder, MD 10/17/23 [phone]
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
## 2023-10-16 NOTE — Discharge Instructions (Signed)
 As we discussed, your evaluation today did not show any infection or lung issue. You can be discharged home. Please find a primary care provider for routine care.

## 2023-10-22 ENCOUNTER — Encounter (HOSPITAL_COMMUNITY): Payer: Self-pay

## 2023-10-22 ENCOUNTER — Other Ambulatory Visit: Payer: Self-pay

## 2023-10-22 ENCOUNTER — Inpatient Hospital Stay (HOSPITAL_COMMUNITY)
Admission: EM | Admit: 2023-10-22 | Discharge: 2023-10-28 | DRG: 331 | Disposition: A | Discharge status: Home / Self Care | Attending: General Surgery | Admitting: General Surgery

## 2023-10-22 DIAGNOSIS — K56609 Unspecified intestinal obstruction, unspecified as to partial versus complete obstruction: Principal | ICD-10-CM | POA: Diagnosis present

## 2023-10-22 DIAGNOSIS — F17210 Nicotine dependence, cigarettes, uncomplicated: Secondary | ICD-10-CM | POA: Diagnosis present

## 2023-10-22 DIAGNOSIS — R109 Unspecified abdominal pain: Secondary | ICD-10-CM | POA: Diagnosis not present

## 2023-10-22 DIAGNOSIS — K562 Volvulus: Secondary | ICD-10-CM | POA: Diagnosis not present

## 2023-10-22 DIAGNOSIS — F419 Anxiety disorder, unspecified: Secondary | ICD-10-CM | POA: Diagnosis present

## 2023-10-22 LAB — COMPREHENSIVE METABOLIC PANEL WITH GFR
ALT: 62 U/L — ABNORMAL HIGH (ref 0–44)
AST: 50 U/L — ABNORMAL HIGH (ref 15–41)
Albumin: 4 g/dL (ref 3.5–5.0)
Alkaline Phosphatase: 58 U/L (ref 38–126)
Anion gap: 9 (ref 5–15)
BUN: 17 mg/dL (ref 6–20)
CO2: 25 mmol/L (ref 22–32)
Calcium: 9.2 mg/dL (ref 8.9–10.3)
Chloride: 101 mmol/L (ref 98–111)
Creatinine, Ser: 0.78 mg/dL (ref 0.61–1.24)
GFR, Estimated: >60 mL/min (ref >60)
Glucose, Bld: 104 mg/dL — ABNORMAL HIGH (ref 70–99)
Potassium: 3.8 mmol/L (ref 3.5–5.1)
Sodium: 135 mmol/L (ref 135–145)
Total Bilirubin: 1 mg/dL (ref 0.0–1.2)
Total Protein: 8 g/dL (ref 6.5–8.1)

## 2023-10-22 LAB — CBC
HCT: 38.1 % — ABNORMAL LOW (ref 39.0–52.0)
Hemoglobin: 12.4 g/dL — ABNORMAL LOW (ref 13.0–17.0)
MCH: 28.9 pg (ref 26.0–34.0)
MCHC: 32.5 g/dL (ref 30.0–36.0)
MCV: 88.8 fL (ref 80.0–100.0)
Platelets: 255 10*3/uL (ref 150–400)
RBC: 4.29 MIL/uL (ref 4.22–5.81)
RDW: 12.5 % (ref 11.5–15.5)
WBC: 4.9 10*3/uL (ref 4.0–10.5)
nRBC: 0 % (ref 0.0–0.2)

## 2023-10-22 LAB — LIPASE, BLOOD: Lipase: 25 U/L (ref 11–51)

## 2023-10-22 MED ORDER — SODIUM CHLORIDE 0.9 % IV BOLUS
1000.0000 mL | Freq: Once | INTRAVENOUS | Status: AC
  Administered 2023-10-22: 1000 mL via INTRAVENOUS

## 2023-10-22 MED ORDER — ONDANSETRON HCL 4 MG/2ML IJ SOLN
4.0000 mg | Freq: Once | INTRAMUSCULAR | Status: AC
  Administered 2023-10-22: 4 mg via INTRAVENOUS
  Filled 2023-10-22: qty 2

## 2023-10-22 MED ORDER — FENTANYL CITRATE PF 50 MCG/ML IJ SOSY
50.0000 ug | PREFILLED_SYRINGE | Freq: Once | INTRAMUSCULAR | Status: AC
  Administered 2023-10-22: 50 ug via INTRAVENOUS
  Filled 2023-10-22: qty 1

## 2023-10-22 NOTE — ED Triage Notes (Signed)
 Pt BIB EMS for lower abdominal pain for 8 hours. Pt has not had anything but it feels like cramps that come every three minutes. Nausea but no vomiting. When pt tries to have a bowel movement only mucus comes out.   98%  110 cbg 124/74

## 2023-10-22 NOTE — ED Provider Notes (Signed)
  EMERGENCY DEPARTMENT AT [facility] Provider Note   CSN: [id number] Arrival date & time: 10/22/23  8061     Patient presents with: Abdominal Pain   Robert Duffy is a 43 y.o. male.  {Add pertinent medical, surgical, social history, OB history to YEP:67052} The history is provided by the patient and medical records.  Abdominal Pain  43 y.o. M with hx of anxiety, presenting to the ED for abdominal pain.  States he has been having pain for about 12 hours now, seems to be worsening throughout the day.  Pain all throughout the lower abdomen.  Reports some nausea but denies any vomiting.  Attempted to have a bowel movement but only passed some clear mucus.  States he continues to have a cramping sensation throughout his lower abdomen coming in waves.  He denies any fevers or chills.  He has not had any sick contacts.  Denies any urinary symptoms.  Denies any prior abdominal surgeries.  No intervention tried prior to arrival.  Prior to Admission medications   Medication Sig Start Date End Date Taking? Authorizing Provider  doxycycline  (VIBRAMYCIN ) 100 MG capsule Take 1 capsule (100 mg total) by mouth 2 (two) times daily. 08/21/23   Minnie Tinnie BRAVO, PA  famotidine  (PEPCID ) 20 MG tablet Take 1 tablet (20 mg total) by mouth 2 (two) times daily. Patient not taking: Reported on 08/06/2023 07/18/20   Geiple, Joshua, PA-C  pantoprazole  (PROTONIX ) 40 MG tablet Take 1 tablet (40 mg total) by mouth daily for 28 days. Patient not taking: Reported on 08/06/2023 12/19/22 08/06/23  [name], PA-C    Allergies: Other    Review of Systems  Gastrointestinal:  Positive for abdominal pain.  All other systems reviewed and are negative.   Updated Vital Signs BP 117/83 (BP Location: Left Arm)   Pulse 62   Temp 98 F (36.7 C) (Oral)   Resp 18   SpO2 100%   Physical Exam Vitals and nursing note reviewed.  Constitutional:      Appearance: He is well-developed.  HENT:      Head: Normocephalic and atraumatic.   Eyes:     Conjunctiva/sclera: Conjunctivae normal.     Pupils: Pupils are equal, round, and reactive to light.    Cardiovascular:     Rate and Rhythm: Normal rate and regular rhythm.     Heart sounds: Normal heart sounds.  Pulmonary:     Effort: Pulmonary effort is normal.     Breath sounds: Normal breath sounds.  Abdominal:     General: Bowel sounds are normal.     Palpations: Abdomen is soft.     Tenderness: There is abdominal tenderness.   Musculoskeletal:        General: Normal range of motion.     Cervical back: Normal range of motion.   Skin:    General: Skin is warm and dry.   Neurological:     Mental Status: He is alert and oriented to person, place, and time.    (all labs ordered are listed, but only abnormal results are displayed) Labs Reviewed  COMPREHENSIVE METABOLIC PANEL WITH GFR - Abnormal; Notable for the following components:      Result Value   Glucose, Bld 104 (*)    AST 50 (*)    ALT 62 (*)    All other components within normal limits  CBC - Abnormal; Notable for the following components:   Hemoglobin 12.4 (*)    HCT 38.1 (*)  All other components within normal limits  LIPASE, BLOOD  URINALYSIS, ROUTINE W REFLEX MICROSCOPIC    EKG: None  Radiology: No results found.  {Document cardiac monitor, telemetry assessment procedure when appropriate:32947} Procedures   Medications Ordered in the ED - No data to display    {Click here for ABCD2, HEART and other calculators REFRESH Note before signing:1}                              Medical Decision Making Amount and/or Complexity of Data Reviewed Labs: ordered. Radiology: ordered.  Risk Prescription drug management.   ***  {Document critical care time when appropriate  Document review of labs and clinical decision tools ie CHADS2VASC2, etc  Document your independent review of radiology images and any outside records  Document your discussion  with family members, caretakers and with consultants  Document social determinants of health affecting pt's care  Document your decision making why or why not admission, treatments were needed:32947:::1}   Final diagnoses:  None    ED Discharge Orders     None

## 2023-10-23 ENCOUNTER — Observation Stay (HOSPITAL_COMMUNITY): Admitting: Certified Registered"

## 2023-10-23 ENCOUNTER — Encounter (HOSPITAL_COMMUNITY): Admission: EM | Disposition: A | Discharge status: Home / Self Care | Payer: Self-pay | Source: Home / Self Care

## 2023-10-23 ENCOUNTER — Emergency Department (HOSPITAL_COMMUNITY)

## 2023-10-23 DIAGNOSIS — F419 Anxiety disorder, unspecified: Secondary | ICD-10-CM | POA: Diagnosis present

## 2023-10-23 DIAGNOSIS — Z5331 Laparoscopic surgical procedure converted to open procedure: Secondary | ICD-10-CM | POA: Diagnosis not present

## 2023-10-23 DIAGNOSIS — K562 Volvulus: Secondary | ICD-10-CM | POA: Diagnosis present

## 2023-10-23 DIAGNOSIS — R109 Unspecified abdominal pain: Secondary | ICD-10-CM | POA: Diagnosis present

## 2023-10-23 DIAGNOSIS — K56609 Unspecified intestinal obstruction, unspecified as to partial versus complete obstruction: Principal | ICD-10-CM | POA: Diagnosis present

## 2023-10-23 DIAGNOSIS — F17210 Nicotine dependence, cigarettes, uncomplicated: Secondary | ICD-10-CM | POA: Diagnosis present

## 2023-10-23 HISTORY — PX: COLON RESECTION SIGMOID: SHX6737

## 2023-10-23 HISTORY — PX: LAPAROSCOPY: SHX197

## 2023-10-23 HISTORY — PX: COLOSTOMY: SHX63

## 2023-10-23 LAB — HIV ANTIBODY (ROUTINE TESTING W REFLEX): HIV Screen 4th Generation wRfx: NONREACTIVE

## 2023-10-23 SURGERY — LAPAROSCOPY, DIAGNOSTIC
Anesthesia: General | Site: Abdomen

## 2023-10-23 MED ORDER — LIDOCAINE HCL (PF) 2 % IJ SOLN
INTRAMUSCULAR | Status: DC | PRN
  Administered 2023-10-23: 60 mg via INTRADERMAL

## 2023-10-23 MED ORDER — LACTATED RINGERS IV SOLN: INTRAVENOUS | Status: AC

## 2023-10-23 MED ORDER — HYDROMORPHONE HCL 1 MG/ML IJ SOLN
0.5000 mg | INTRAMUSCULAR | Status: DC | PRN
  Administered 2023-10-23: 0.5 mg via INTRAVENOUS
  Filled 2023-10-23: qty 1

## 2023-10-23 MED ORDER — ENOXAPARIN SODIUM 40 MG/0.4ML IJ SOSY
40.0000 mg | PREFILLED_SYRINGE | INTRAMUSCULAR | Status: DC
  Administered 2023-10-23 – 2023-10-28 (×6): 40 mg via SUBCUTANEOUS
  Filled 2023-10-23 (×6): qty 0.4

## 2023-10-23 MED ORDER — OXYCODONE HCL 5 MG PO TABS: 5.0000 mg | ORAL_TABLET | ORAL | Status: DC | PRN

## 2023-10-23 MED ORDER — PROPOFOL 10 MG/ML IV BOLUS
INTRAVENOUS | Status: DC | PRN
  Administered 2023-10-23: 200 mg via INTRAVENOUS

## 2023-10-23 MED ORDER — PROPOFOL 10 MG/ML IV BOLUS
INTRAVENOUS | Status: AC
  Filled 2023-10-23: qty 20

## 2023-10-23 MED ORDER — BUPIVACAINE-EPINEPHRINE (PF) 0.25% -1:200000 IJ SOLN
INTRAMUSCULAR | Status: AC
  Filled 2023-10-23: qty 30

## 2023-10-23 MED ORDER — ROCURONIUM BROMIDE 10 MG/ML (PF) SYRINGE
PREFILLED_SYRINGE | INTRAVENOUS | Status: AC
  Filled 2023-10-23: qty 10

## 2023-10-23 MED ORDER — ONDANSETRON HCL 4 MG/2ML IJ SOLN
INTRAMUSCULAR | Status: DC | PRN
  Administered 2023-10-23: 4 mg via INTRAVENOUS

## 2023-10-23 MED ORDER — LIDOCAINE HCL (PF) 2 % IJ SOLN
INTRAMUSCULAR | Status: AC
  Filled 2023-10-23: qty 5

## 2023-10-23 MED ORDER — CHLORHEXIDINE GLUCONATE CLOTH 2 % EX PADS: 6.0000 | MEDICATED_PAD | Freq: Once | CUTANEOUS | Status: DC

## 2023-10-23 MED ORDER — ONDANSETRON 4 MG PO TBDP
4.0000 mg | ORAL_TABLET | Freq: Four times a day (QID) | ORAL | Status: DC | PRN
Start: 2023-10-23 — End: 2023-10-29

## 2023-10-23 MED ORDER — KETOROLAC TROMETHAMINE 30 MG/ML IJ SOLN
INTRAMUSCULAR | Status: AC
Start: 2023-10-23 — End: 2023-10-23
  Filled 2023-10-23: qty 1

## 2023-10-23 MED ORDER — ONDANSETRON HCL 4 MG/2ML IJ SOLN
INTRAMUSCULAR | Status: AC
  Filled 2023-10-23: qty 2

## 2023-10-23 MED ORDER — CEFAZOLIN SODIUM-DEXTROSE 2-4 GM/100ML-% IV SOLN: 2.0000 g | INTRAVENOUS | Status: DC

## 2023-10-23 MED ORDER — 0.9 % SODIUM CHLORIDE (POUR BTL) OPTIME
TOPICAL | Status: DC | PRN
  Administered 2023-10-23: 2000 mL

## 2023-10-23 MED ORDER — DEXAMETHASONE SODIUM PHOSPHATE 10 MG/ML IJ SOLN
INTRAMUSCULAR | Status: AC
  Filled 2023-10-23: qty 1

## 2023-10-23 MED ORDER — SUCCINYLCHOLINE CHLORIDE 200 MG/10ML IV SOSY
PREFILLED_SYRINGE | INTRAVENOUS | Status: DC | PRN
  Administered 2023-10-23: 80 mg via INTRAVENOUS

## 2023-10-23 MED ORDER — DIPHENHYDRAMINE HCL 25 MG PO CAPS: 25.0000 mg | ORAL_CAPSULE | Freq: Four times a day (QID) | ORAL | Status: DC | PRN

## 2023-10-23 MED ORDER — CHLORHEXIDINE GLUCONATE CLOTH 2 % EX PADS
6.0000 | MEDICATED_PAD | Freq: Every day | CUTANEOUS | Status: DC
  Administered 2023-10-23 – 2023-10-28 (×5): 6 via TOPICAL

## 2023-10-23 MED ORDER — BUPIVACAINE-EPINEPHRINE 0.25% -1:200000 IJ SOLN
INTRAMUSCULAR | Status: DC | PRN
  Administered 2023-10-23: 1 mL

## 2023-10-23 MED ORDER — METHOCARBAMOL 500 MG PO TABS: 500.0000 mg | ORAL_TABLET | Freq: Three times a day (TID) | ORAL | Status: DC | PRN

## 2023-10-23 MED ORDER — MIDAZOLAM HCL 2 MG/2ML IJ SOLN
INTRAMUSCULAR | Status: AC
  Filled 2023-10-23: qty 2

## 2023-10-23 MED ORDER — ACETAMINOPHEN 10 MG/ML IV SOLN
INTRAVENOUS | Status: DC | PRN
Start: 2023-10-23 — End: 2023-10-23
  Administered 2023-10-23: 1000 mg via INTRAVENOUS

## 2023-10-23 MED ORDER — IOHEXOL 300 MG/ML  SOLN
100.0000 mL | Freq: Once | INTRAMUSCULAR | Status: AC | PRN
Start: 2023-10-23 — End: 2023-10-23
  Administered 2023-10-23: 100 mL via INTRAVENOUS

## 2023-10-23 MED ORDER — CEFAZOLIN SODIUM-DEXTROSE 2-3 GM-%(50ML) IV SOLR
INTRAVENOUS | Status: DC | PRN
  Administered 2023-10-23: 2 g via INTRAVENOUS

## 2023-10-23 MED ORDER — METHOCARBAMOL 1000 MG/10ML IJ SOLN: 500.0000 mg | Freq: Three times a day (TID) | INTRAMUSCULAR | Status: DC | PRN

## 2023-10-23 MED ORDER — DIPHENHYDRAMINE HCL 50 MG/ML IJ SOLN: 25.0000 mg | Freq: Four times a day (QID) | INTRAMUSCULAR | Status: DC | PRN

## 2023-10-23 MED ORDER — METHOCARBAMOL 1000 MG/10ML IJ SOLN
500.0000 mg | Freq: Three times a day (TID) | INTRAMUSCULAR | Status: DC
  Administered 2023-10-23 – 2023-10-28 (×16): 500 mg via INTRAVENOUS
  Filled 2023-10-23 (×16): qty 10

## 2023-10-23 MED ORDER — ACETAMINOPHEN 500 MG PO TABS
1000.0000 mg | ORAL_TABLET | Freq: Three times a day (TID) | ORAL | Status: DC
  Administered 2023-10-23 – 2023-10-28 (×17): 1000 mg via ORAL
  Filled 2023-10-23 (×17): qty 2

## 2023-10-23 MED ORDER — FENTANYL CITRATE PF 50 MCG/ML IJ SOSY
25.0000 ug | PREFILLED_SYRINGE | INTRAMUSCULAR | Status: DC | PRN
  Administered 2023-10-23 (×3): 50 ug via INTRAVENOUS

## 2023-10-23 MED ORDER — ROCURONIUM BROMIDE 10 MG/ML (PF) SYRINGE
PREFILLED_SYRINGE | INTRAVENOUS | Status: DC | PRN
  Administered 2023-10-23: 50 mg via INTRAVENOUS

## 2023-10-23 MED ORDER — OXYCODONE HCL 5 MG PO TABS: 5.0000 mg | ORAL_TABLET | Freq: Once | ORAL | Status: DC | PRN

## 2023-10-23 MED ORDER — FENTANYL CITRATE (PF) 100 MCG/2ML IJ SOLN
INTRAMUSCULAR | Status: AC
  Filled 2023-10-23: qty 2

## 2023-10-23 MED ORDER — OXYCODONE HCL 5 MG/5ML PO SOLN: 5.0000 mg | Freq: Once | ORAL | Status: DC | PRN

## 2023-10-23 MED ORDER — ACETAMINOPHEN 10 MG/ML IV SOLN
INTRAVENOUS | Status: AC
  Filled 2023-10-23: qty 100

## 2023-10-23 MED ORDER — DEXAMETHASONE SODIUM PHOSPHATE 10 MG/ML IJ SOLN
INTRAMUSCULAR | Status: DC | PRN
  Administered 2023-10-23: 10 mg via INTRAVENOUS

## 2023-10-23 MED ORDER — AMISULPRIDE (ANTIEMETIC) 5 MG/2ML IV SOLN: 10.0000 mg | Freq: Once | INTRAVENOUS | Status: DC | PRN

## 2023-10-23 MED ORDER — SUCCINYLCHOLINE CHLORIDE 200 MG/10ML IV SOSY
PREFILLED_SYRINGE | INTRAVENOUS | Status: AC
  Filled 2023-10-23: qty 10

## 2023-10-23 MED ORDER — KETOROLAC TROMETHAMINE 30 MG/ML IJ SOLN
30.0000 mg | Freq: Once | INTRAMUSCULAR | Status: AC | PRN
  Administered 2023-10-23: 30 mg via INTRAVENOUS

## 2023-10-23 MED ORDER — FENTANYL CITRATE PF 50 MCG/ML IJ SOSY
PREFILLED_SYRINGE | INTRAMUSCULAR | Status: AC
  Filled 2023-10-23: qty 3

## 2023-10-23 MED ORDER — ACETAMINOPHEN 10 MG/ML IV SOLN: 1000.0000 mg | Freq: Once | INTRAVENOUS | Status: DC | PRN

## 2023-10-23 MED ORDER — PHENYLEPHRINE HCL-NACL 20-0.9 MG/250ML-% IV SOLN
INTRAVENOUS | Status: AC
  Filled 2023-10-23: qty 250

## 2023-10-23 MED ORDER — ONDANSETRON HCL 4 MG/2ML IJ SOLN: 4.0000 mg | Freq: Four times a day (QID) | INTRAMUSCULAR | Status: DC | PRN

## 2023-10-23 MED ORDER — CEFAZOLIN SODIUM-DEXTROSE 2-4 GM/100ML-% IV SOLN
INTRAVENOUS | Status: AC
  Filled 2023-10-23: qty 100

## 2023-10-23 MED ORDER — SUGAMMADEX SODIUM 200 MG/2ML IV SOLN
INTRAVENOUS | Status: DC | PRN
  Administered 2023-10-23: 200 mg via INTRAVENOUS

## 2023-10-23 MED ORDER — FENTANYL CITRATE (PF) 100 MCG/2ML IJ SOLN
INTRAMUSCULAR | Status: DC | PRN
  Administered 2023-10-23 (×2): 50 ug via INTRAVENOUS
  Administered 2023-10-23: 100 ug via INTRAVENOUS

## 2023-10-23 MED ORDER — MIDAZOLAM HCL 2 MG/2ML IJ SOLN
INTRAMUSCULAR | Status: DC | PRN
  Administered 2023-10-23: 2 mg via INTRAVENOUS

## 2023-10-23 MED ORDER — HYDROMORPHONE HCL 1 MG/ML IJ SOLN
0.5000 mg | INTRAMUSCULAR | Status: DC | PRN
  Administered 2023-10-23: 0.5 mg via INTRAVENOUS
  Filled 2023-10-23: qty 0.5

## 2023-10-23 MED ORDER — FENTANYL CITRATE (PF) 100 MCG/2ML IJ SOLN
INTRAMUSCULAR | Status: AC
Start: 2023-10-23 — End: 2023-10-23
  Filled 2023-10-23: qty 2

## 2023-10-23 SURGICAL SUPPLY — 60 items
BAG COUNTER SPONGE SURGICOUNT (BAG) ×2 IMPLANT
BLADE CLIPPER SURG (BLADE) IMPLANT
BLADE EXTENDED COATED 6.5IN (ELECTRODE) IMPLANT
BLADE SURG SZ10 CARB STEEL (BLADE) ×2 IMPLANT
CANISTER SUCT 3000ML PPV (MISCELLANEOUS) ×2 IMPLANT
CELLS DAT CNTRL 66122 CELL SVR (MISCELLANEOUS) IMPLANT
CHLORAPREP W/TINT 26 (MISCELLANEOUS) ×2 IMPLANT
CLIP APPLIE ROT 10 11.4 M/L (STAPLE) IMPLANT
COVER MAYO STAND STRL (DRAPES) IMPLANT
COVER SURGICAL LIGHT HANDLE (MISCELLANEOUS) ×2 IMPLANT
DERMABOND ADVANCED .7 DNX12 (GAUZE/BANDAGES/DRESSINGS) ×2 IMPLANT
DRAPE INCISE IOBAN 66X45 STRL (DRAPES) IMPLANT
DRAPE UTILITY XL STRL (DRAPES) IMPLANT
DRAPE WARM FLUID 44X44 (DRAPES) ×2 IMPLANT
DRSG OPSITE POSTOP 4X8 (GAUZE/BANDAGES/DRESSINGS) IMPLANT
ELECT REM PT RETURN 15FT ADLT (MISCELLANEOUS) ×2 IMPLANT
GAUZE SPONGE 4X4 12PLY STRL (GAUZE/BANDAGES/DRESSINGS) IMPLANT
GLOVE BIOGEL PI IND STRL 6 (GLOVE) ×2 IMPLANT
GLOVE BIOGEL PI MICRO STRL 5.5 (GLOVE) ×2 IMPLANT
GOWN STRL REUS W/ TWL LRG LVL3 (GOWN DISPOSABLE) ×4 IMPLANT
HANDLE SUCTION POOLE (INSTRUMENTS) IMPLANT
IRRIGATION SUCT STRKRFLW 2 WTP (MISCELLANEOUS) IMPLANT
KIT BASIN OR (CUSTOM PROCEDURE TRAY) ×2 IMPLANT
KIT TURNOVER KIT A (KITS) ×2 IMPLANT
LHOOK LAP DISP 36CM (ELECTROSURGICAL) IMPLANT
LIGASURE IMPACT 36 18CM CVD LR (INSTRUMENTS) IMPLANT
NS IRRIG 1000ML POUR BTL (IV SOLUTION) ×4 IMPLANT
PENCIL SMOKE EVACUATOR (MISCELLANEOUS) ×2 IMPLANT
RELOAD PROXIMATE 75MM BLUE (ENDOMECHANICALS) ×4 IMPLANT
RELOAD STAPLE 75 3.8 BLU REG (ENDOMECHANICALS) IMPLANT
RETRACTOR WND ALEXIS 18 MED (MISCELLANEOUS) IMPLANT
RETRACTOR WND ALEXIS 25 LRG (MISCELLANEOUS) IMPLANT
SCISSORS LAP 5X35 DISP (ENDOMECHANICALS) IMPLANT
SET TUBE SMOKE EVAC HIGH FLOW (TUBING) ×2 IMPLANT
SHEARS HARMONIC 36 ACE (MISCELLANEOUS) IMPLANT
SLEEVE ADV FIXATION 5X100MM (TROCAR) ×4 IMPLANT
SPIKE FLUID TRANSFER (MISCELLANEOUS) ×2 IMPLANT
SPONGE T-LAP 18X18 ~~LOC~~+RFID (SPONGE) IMPLANT
STAPLER PROXIMATE 75MM BLUE (STAPLE) IMPLANT
STAPLER SKIN PROX 35W (STAPLE) IMPLANT
SUT MNCRL AB 4-0 PS2 18 (SUTURE) ×2 IMPLANT
SUT PDS AB 0 CT 36 (SUTURE) IMPLANT
SUT PDS AB 0 CTX 36 PDP370T (SUTURE) IMPLANT
SUT PDS AB 1 CT 36 (SUTURE) IMPLANT
SUT PDS AB 1 TP1 96 (SUTURE) IMPLANT
SUT SILK 2 0 SH CR/8 (SUTURE) ×2 IMPLANT
SUT SILK 3 0 SH CR/8 (SUTURE) IMPLANT
SUT VIC AB 3-0 SH 27X BRD (SUTURE) IMPLANT
SUT VICRYL 3-0 CR8 SH (SUTURE) IMPLANT
SYR BULB IRRIG 60ML STRL (SYRINGE) IMPLANT
SYSTEM LAPSCP GELPORT 120MM (MISCELLANEOUS) IMPLANT
TOWEL GREEN STERILE FF (TOWEL DISPOSABLE) ×4 IMPLANT
TRAY FOLEY MTR SLVR 14FR STAT (SET/KITS/TRAYS/PACK) ×2 IMPLANT
TRAY LAPAROSCOPIC (CUSTOM PROCEDURE TRAY) ×2 IMPLANT
TROCAR 11X100 Z THREAD (TROCAR) IMPLANT
TROCAR ADV FIXATION 12X100MM (TROCAR) IMPLANT
TROCAR BALLN 12MMX100 BLUNT (TROCAR) IMPLANT
TROCAR Z-THREAD FIOS 5X100MM (TROCAR) ×2 IMPLANT
WATER STERILE IRR 1000ML POUR (IV SOLUTION) ×2 IMPLANT
YANKAUER SUCT BULB TIP 10FT TU (MISCELLANEOUS) IMPLANT

## 2023-10-23 NOTE — Addendum Note (Signed)
 Addendum  created 10/23/23 1814 by Para Jerelene CROME, CRNA   Clinical Note Signed, Flowsheet accepted

## 2023-10-23 NOTE — Anesthesia Preprocedure Evaluation (Addendum)
 Anesthesia Evaluation  Patient identified by MRN, date of birth, ID band Patient awake    Reviewed: Allergy & Precautions, NPO status , Patient's Chart, lab work & pertinent test results  Airway Mallampati: II  TM Distance: >3 FB Neck ROM: Full    Dental no notable dental hx.    Pulmonary Current Smoker and Patient abstained from smoking.   Pulmonary exam normal        Cardiovascular negative cardio ROS Normal cardiovascular exam     Neuro/Psych  PSYCHIATRIC DISORDERS Anxiety     negative neurological ROS     GI/Hepatic negative GI ROS,,,(+)     substance abuse    Endo/Other  negative endocrine ROS    Renal/GU negative Renal ROS     Musculoskeletal negative musculoskeletal ROS (+)    Abdominal   Peds  Hematology negative hematology ROS (+)   Anesthesia Other Findings Large Bowel Obstruction  Reproductive/Obstetrics                             Anesthesia Physical Anesthesia Plan  ASA: 2  Anesthesia Plan: General   Post-op Pain Management:    Induction: Intravenous  PONV Risk Score and Plan: 1 and Ondansetron , Dexamethasone, Midazolam and Treatment may vary due to age or medical condition  Airway Management Planned: Oral ETT  Additional Equipment:   Intra-op Plan:   Post-operative Plan: Extubation in OR  Informed Consent: I have reviewed the patients History and Physical, chart, labs and discussed the procedure including the risks, benefits and alternatives for the proposed anesthesia with the patient or authorized representative who has indicated his/her understanding and acceptance.     Dental advisory given  Plan Discussed with: CRNA  Anesthesia Plan Comments:        Anesthesia Quick Evaluation

## 2023-10-23 NOTE — Anesthesia Postprocedure Evaluation (Signed)
 Anesthesia Post Note  Patient: Robert Duffy  Procedure(s) Performed: LAPAROSCOPY, DIAGNOSTIC COLECTOMY, SIGMOID, OPEN (Abdomen) CREATION, COLOSTOMY (Abdomen)     Patient location during evaluation: PACU Anesthesia Type: General Level of consciousness: awake Pain management: pain level controlled Vital Signs Assessment: post-procedure vital signs reviewed and stable Respiratory status: spontaneous breathing, nonlabored ventilation and respiratory function stable Cardiovascular status: blood pressure returned to baseline and stable Postop Assessment: no apparent nausea or vomiting Anesthetic complications: no   No notable events documented.  Last Vitals:  Vitals:   10/23/23 1315 10/23/23 1438  BP: 126/85 118/76  Pulse: (!) 56 (!) 59  Resp: 16 15  Temp: 36.6 C 36.5 C  SpO2: 100% 97%    Last Pain:  Vitals:   10/23/23 1438  TempSrc: Oral  PainSc:                  Robert Duffy

## 2023-10-23 NOTE — Interval H&P Note (Signed)
 History and Physical Interval Note:  10/23/2023 7:11 AM  Robert Duffy  has presented today for surgery, with the diagnosis of Large Bowel Obstruction.  The various methods of treatment have been discussed with the patient and family. After consideration of risks, benefits and other options for treatment, the patient has consented to  Procedure(s): LAPAROSCOPY, DIAGNOSTIC (N/A) as a surgical intervention. I reviewed the possibility of an exploratory laparotomy, bowel resection, and the possibility of a colostomy. He expressed understanding and agrees to proceed with surgery. The patient's history has been reviewed, patient examined, no change in status, stable for surgery.  I have reviewed the patient's chart and labs.  Questions were answered to the patient's satisfaction.     Leonor LITTIE Dawn

## 2023-10-23 NOTE — Consult Note (Signed)
 WOC consulted for new colostomy Will see patient Monday for post op initial teaching and care.   [name] [facility], CNS, [employer] [phone]

## 2023-10-23 NOTE — Anesthesia Procedure Notes (Signed)
 Procedure Name: Intubation Date/Time: 10/23/2023 8:08 AM  Performed by: Para Jerelene CROME, CRNAPre-anesthesia Checklist: Patient identified, Emergency Drugs available, Suction available and Patient being monitored Patient Re-evaluated:Patient Re-evaluated prior to induction Oxygen Delivery Method: Circle system utilized Preoxygenation: Pre-oxygenation with 100% oxygen Induction Type: IV induction, Rapid sequence and Cricoid Pressure applied Ventilation: Mask ventilation without difficulty Laryngoscope Size: Mac and 4 Grade View: Grade II Tube type: Oral Tube size: 7.5 mm Number of attempts: 1 Airway Equipment and Method: Stylet Placement Confirmation: ETT inserted through vocal cords under direct vision, positive ETCO2, CO2 detector and breath sounds checked- equal and bilateral Secured at: 23 (OETT secured at 23 cm upper lip.) cm Tube secured with: Tape Dental Injury: Teeth and Oropharynx as per pre-operative assessment  Comments: Modified RSI. Atraumatic intubation. Lips ans teeth remain in preoperative condition.

## 2023-10-23 NOTE — Transfer of Care (Addendum)
 Immediate Anesthesia Transfer of Care Note  Patient: Robert Duffy  Procedure(s) Performed: LAPAROSCOPY, DIAGNOSTIC COLECTOMY, SIGMOID, OPEN (Abdomen) CREATION, COLOSTOMY (Abdomen)  Patient Location: PACU  Anesthesia Type:General  Level of Consciousness: drowsy and patient cooperative  Airway & Oxygen Therapy: Patient Spontanous Breathing and Patient connected to face mask oxygen  Post-op Assessment: Report given to RN and Post -op Vital signs reviewed and stable  Post vital signs: Reviewed and stable  Last Vitals:  Vitals Value Taken Time  BP 126/77 10/23/23 09:30  Temp 36.5 10/23/23   0930  Pulse 64 10/23/23 09:33  Resp 11 10/23/23 09:33  SpO2 100 % 10/23/23 09:33  Vitals shown include unfiled device data.  Last Pain:  Vitals:   10/23/23 0705  TempSrc:   PainSc: 0-No pain         Complications: No notable events documented.

## 2023-10-23 NOTE — Op Note (Signed)
 Date: 10/23/23  Patient: Robert Duffy MRN: [id number]  Preoperative Diagnosis: Large bowel obstruction Postoperative Diagnosis: Sigmoid volvulus  Procedure:  Diagnostic laparoscopy Open sigmoid colectomy with end colostomy  Surgeon: Robert Dawn, MD  EBL: 20 mL  Anesthesia: General endotracheal  Specimens: Sigmoid colon (stitch marks distal margin)  Indications: Robert Duffy is a 43 yo male who presented to the ED with about 12 hours of acute crampy lower abdominal pain and obstipation. He was hemodynamically stable without leukocytosis. A CT scan showed a mesenteric swirl in the distal colon, with an apparent transition point and proximal colonic distension. After a discussion of the risks and benefits of surgery, the patient agreed to proceed with operative exploration.  Findings: Sigmoid volvulus without ischemia or necrosis. The redundant sigmoid was resected and an end descending colostomy was created.  Procedure details: Informed consent was obtained in the preoperative area prior to the procedure. The patient was brought to the operating room and placed on the table in the supine position. General anesthesia was induced and appropriate lines and drains were placed for intraoperative monitoring. Perioperative antibiotics were administered per SCIP guidelines. The abdomen was prepped and draped in the usual sterile fashion. A pre-procedure timeout was taken verifying patient identity, surgical site and procedure to be performed.  A small skin incision was made in the left lower quadrant, the fascia was grasped and elevated, and a Veress needle was inserted through the fascia.  Intraperitoneal placement was confirmed with the saline drop test, the abdomen was insufflated, and a 5mm Visiport was placed.  There was marked distention of the colon in the lower abdomen and visualization was extremely poor due to the degree of colonic distention.  Thus further laparoscopy was aborted and  the decision was made to convert to a laparotomy.  The trocar was removed.  A lower midline skin incision was made and the subcutaneous tissue was divided with cautery to expose the fascia.  The fascia was opened along the linea alba and the peritoneum was opened with cautery.  An Alexis wound protector was placed.  There was distended colon deep to the wound which was eviscerated.  It then became apparent that this was volvulized sigmoid colon.  The colon was untwisted.  There was some mesenteric congestion but no signs of ischemia or necrosis.  The small bowel was normal in appearance without any signs of obstruction.  The ascending and transverse colon were mildly dilated but well-perfused.  The descending colon proximal to the point of volvulus was decompressed and healthy.  A mesenteric window was created with cautery on the distal descending colon.  The colon was divided at this point using a 75mm GIA stapler with a blue load.  A mesenteric window was then created at the rectosigmoid junction with cautery, and the colon was divided with a 75 mm GIA stapler with a blue load.  The intervening mesentery was divided with LigaSure.  The specimen was oriented and sent for routine pathology.  As the patient had not undergone a bowel prep and the rectal stump was mildly dilated with mesenteric congestion, I elected not to perform an anastomosis.  A circular skin incision was made around the previous 5mm port site in the left lower quadrant abdominal wall.  The skin and subcutaneous tissue were excised.  The anterior rectus sheath was vertically incised, the muscle fibers were spread, and the posterior rectus sheath and peritoneum were vertically incised.  The opening was large enough to easily accommodate 2 fingerbreadths.  The distal end of the descending colon was brought through the abdominal wall, taking care not to twist the mesentery, and it easily reached the skin without any tension.  The fascia was then  closed at midline with a running looped 1 PDS suture.  The skin was closed with staples, and a sterile honeycomb dressing was applied.  The colostomy was then matured in [place] fashion using 3-0 Vicryl sutures.  An ostomy appliance was placed.  The patient tolerated the procedure well with no apparent complications.  All counts were correct x2 at the end of the procedure. The patient was extubated and taken to PACU in stable condition.  Robert Dawn, MD 10/23/23 9:19 AM

## 2023-10-23 NOTE — H&P (Signed)
 Robert Duffy 07-Jun-1980  [id number].     HPI:  Robert Duffy is a 43 yo male who presented to the ED with acute onset abdominal pain. The pain started early this morning and is mostly in the lower abdomen. It occurs in episodes every few minutes, with a cramping sensation. He has tried to have bowel movements but has only been able to pass some mucous. He is not passing flatus. WBC is normal and remaining labs are unremarkable. A CT scan showed a mesenteric swirl with concern for obstruction of the distal colon. General surgery was consulted.  He has not had any previous abdominal surgeries and is otherwise in good health.  ROS: Review of Systems  Constitutional:  Negative for chills and fever.  Respiratory:  Negative for shortness of breath.   Cardiovascular:  Negative for chest pain.  Gastrointestinal:  Positive for abdominal pain and constipation. Negative for blood in stool.    History reviewed. No pertinent family history.  History reviewed. No pertinent past medical history.  History reviewed. No pertinent surgical history.  Social History:  reports that he has been smoking cigarettes. He has never used smokeless tobacco. He reports current alcohol use of about 21.0 standard drinks of alcohol per week. He reports current drug use. Frequency: 5.00 times per week. Drugs: Marijuana, Methamphetamines, and Cocaine.  Allergies:  Allergies  Allergen Reactions   Other Shortness Of Breath, Itching and Other (See Comments)    Cannot tolerate strong smells- Car exhaust, Clorox, etc... Itchy eyes, other allergic symptoms, etc..    (Not in a hospital admission)    Physical Exam: Blood pressure (!) 131/91, pulse 61, temperature 97.8 F (36.6 C), temperature source Oral, resp. rate 18, SpO2 100%. General: resting comfortably, appears stated age, no apparent distress Neurological: alert and oriented, no focal deficits HEENT: normocephalic, atraumatic Respiratory: normal work of  breathing on room air Abdomen: soft, nondistended, nontender to deep palpation. No rebound tenderness or guarding. Extremities: warm and well-perfused, no deformities, moving all extremities spontaneously Psychiatric: normal mood and affect Skin: warm and dry, no jaundice, no rashes or lesions   Results for orders placed or performed during the hospital encounter of 10/22/23 (from the past 48 hours)  Lipase, blood     Status: None   Collection Time: 10/22/23  8:01 PM  Result Value Ref Range   Lipase 25 11 - 51 U/L    Comment: Performed at [facility], [address], [place], [place] [address]  Comprehensive metabolic panel     Status: Abnormal   Collection Time: 10/22/23  8:01 PM  Result Value Ref Range   Sodium 135 135 - 145 mmol/L   Potassium 3.8 3.5 - 5.1 mmol/L   Chloride 101 98 - 111 mmol/L   CO2 25 22 - 32 mmol/L   Glucose, Bld 104 (H) 70 - 99 mg/dL    Comment: Glucose reference range applies only to samples taken after fasting for at least 8 hours.   BUN 17 6 - 20 mg/dL   Creatinine, Ser 9.21 0.61 - 1.24 mg/dL   Calcium 9.2 8.9 - 89.6 mg/dL   Total Protein 8.0 6.5 - 8.1 g/dL   Albumin 4.0 3.5 - 5.0 g/dL   AST 50 (H) 15 - 41 U/L   ALT 62 (H) 0 - 44 U/L   Alkaline Phosphatase 58 38 - 126 U/L   Total Bilirubin 1.0 0.0 - 1.2 mg/dL   GFR, Estimated >39 >39 mL/min    Comment: (  NOTE) Calculated using the CKD-EPI Creatinine Equation (2021)    Anion gap 9 5 - 15    Comment: Performed at [facility], [address], [place], [place] [address]  CBC     Status: Abnormal   Collection Time: 10/22/23  8:01 PM  Result Value Ref Range   WBC 4.9 4.0 - 10.5 K/uL   RBC 4.29 4.22 - 5.81 MIL/uL   Hemoglobin 12.4 (L) 13.0 - 17.0 g/dL   HCT 61.8 (L) 60.9 - 47.9 %   MCV 88.8 80.0 - 100.0 fL   MCH 28.9 26.0 - 34.0 pg   MCHC 32.5 30.0 - 36.0 g/dL   RDW 87.4 88.4 - 84.4 %   Platelets 255 150 - 400 K/uL   nRBC 0.0 0.0 - 0.2 %    Comment: Performed  at [facility], [address] [place], [place] [address]   CT ABDOMEN PELVIS W CONTRAST Result Date: 10/23/2023 CLINICAL DATA:  Left lower quadrant pain. EXAM: CT ABDOMEN AND PELVIS WITH CONTRAST TECHNIQUE: Multidetector CT imaging of the abdomen and pelvis was performed using the standard protocol following bolus administration of intravenous contrast. RADIATION DOSE REDUCTION: This exam was performed according to the departmental dose-optimization program which includes automated exposure control, adjustment of the mA and/or kV according to patient size and/or use of iterative reconstruction technique. CONTRAST:  OMNIPAQUE  IOHEXOL  300 MG/ML  SOLN COMPARISON:  None Available. FINDINGS: Lower chest: No acute abnormality. Hepatobiliary: No focal liver abnormality is seen. No gallstones, gallbladder wall thickening, or biliary dilatation. Pancreas: Unremarkable. No pancreatic ductal dilatation or surrounding inflammatory changes. Spleen: Normal in size without focal abnormality. Adrenals/Urinary Tract: Adrenal glands are unremarkable. Kidneys are normal, without renal calculi, focal lesion, or hydronephrosis. Urinary bladder is poorly distended and subsequently limited in evaluation. Stomach/Bowel: Stomach is within normal limits. Appendix appears normal. The small bowel loops throughout the abdomen are collapsed and subsequently limited in evaluation. A redundant transverse colon is seen. A moderate to large amount of stool is seen throughout distended, air-filled loops of large bowel. An abrupt transition zone is seen along an area of mesenteric twisting that is present within the lower abdomen, along the midline (best seen on coronal reformatted images 60-76, CT series 8). It should be noted that the sigmoid colon is poorly visualized. Vascular/Lymphatic: No significant vascular findings are present. No enlarged abdominal or pelvic lymph nodes. Reproductive: The prostate gland is  mildly enlarged and heterogeneous in appearance. Other: No abdominal wall hernia or abnormality. A moderate amount of pelvic free fluid is seen. Musculoskeletal: No acute or significant osseous findings. IMPRESSION: 1. Area of mesenteric twisting within the lower abdomen with subsequent distal large bowel obstruction, as described above. Electronically Signed   By: Suzen Dials M.D.   On: 10/23/2023 01:28      Assessment/Plan 44 yo male presenting with acute abdominal pain and concern for a large bowel obstruction. I personally reviewed his labs, imaging and notes. There is gaseous distension of the colon, with a mesenteric swirl in the lower abdomen. There is no pneumatosis or portal venous gas, and the patient is non-toxic with a reassuring abdominal exam. However given ongoing pain and a swirl sign on CT, I recommended admission and diagnostic lap in the morning for further evaluation. I discussed that depending on intraoperative findings, he may require a laparotomy and/or a bowel resection. - NPO, maintenance IV fluids - Pain and nausea control - VTE: lovenox, SCDs - Dispo: admit to observation,  OR in am   Leonor Dawn, MD [facility] General, Hepatobiliary and Pancreatic Surgery 10/23/23 3:19 AM

## 2023-10-24 ENCOUNTER — Encounter (HOSPITAL_COMMUNITY): Payer: Self-pay | Admitting: Surgery

## 2023-10-24 LAB — CBC
HCT: 37.2 % — ABNORMAL LOW (ref 39.0–52.0)
Hemoglobin: 11.6 g/dL — ABNORMAL LOW (ref 13.0–17.0)
MCH: 27.6 pg (ref 26.0–34.0)
MCHC: 31.2 g/dL (ref 30.0–36.0)
MCV: 88.4 fL (ref 80.0–100.0)
Platelets: 247 10*3/uL (ref 150–400)
RBC: 4.21 MIL/uL — ABNORMAL LOW (ref 4.22–5.81)
RDW: 12.2 % (ref 11.5–15.5)
WBC: 8.7 10*3/uL (ref 4.0–10.5)
nRBC: 0 % (ref 0.0–0.2)

## 2023-10-24 LAB — BASIC METABOLIC PANEL WITH GFR
Anion gap: 9 (ref 5–15)
BUN: 9 mg/dL (ref 6–20)
CO2: 23 mmol/L (ref 22–32)
Calcium: 8.3 mg/dL — ABNORMAL LOW (ref 8.9–10.3)
Chloride: 102 mmol/L (ref 98–111)
Creatinine, Ser: 0.71 mg/dL (ref 0.61–1.24)
GFR, Estimated: >60 mL/min (ref >60)
Glucose, Bld: 109 mg/dL — ABNORMAL HIGH (ref 70–99)
Potassium: 3.7 mmol/L (ref 3.5–5.1)
Sodium: 134 mmol/L — ABNORMAL LOW (ref 135–145)

## 2023-10-24 MED ORDER — DOCUSATE SODIUM 100 MG PO CAPS
100.0000 mg | ORAL_CAPSULE | Freq: Two times a day (BID) | ORAL | Status: DC
  Administered 2023-10-24 – 2023-10-28 (×9): 100 mg via ORAL
  Filled 2023-10-24 (×9): qty 1

## 2023-10-24 MED ORDER — KETOROLAC TROMETHAMINE 15 MG/ML IJ SOLN
15.0000 mg | Freq: Three times a day (TID) | INTRAMUSCULAR | Status: DC
  Administered 2023-10-24 – 2023-10-28 (×14): 15 mg via INTRAVENOUS
  Filled 2023-10-24 (×14): qty 1

## 2023-10-24 NOTE — Progress Notes (Signed)
 1 Day Post-Op  Subjective: No acute issues overnight. Pain controlled. Passing flatus from colostomy. Denies nausea.   Objective: Vital signs in last 24 hours: Temp:  [97.4 F (36.3 C)-99 F (37.2 C)] 98.1 F (36.7 C) (06/29 0609) Pulse Rate:  [45-70] 57 (06/29 0609) Resp:  [12-18] 16 (06/29 0609) BP: (117-146)/(73-101) 119/73 (06/29 0609) SpO2:  [95 %-100 %] 98 % (06/29 0609) Weight:  [81.6 kg] 81.6 kg (06/28 1102) Last BM Date : 10/23/23  Intake/Output from previous day: 06/28 0701 - 06/29 0700 In: 3043.3 [P.O.:1200; I.V.:1693.3; IV Piggyback:150] Out: 3170 [Urine:3150; Blood:20] Intake/Output this shift: No intake/output data recorded.  PE: General: resting comfortably, NAD Neuro: alert and oriented, no focal deficits Resp: normal work of breathing on room air Abdomen: soft, nondistended, appropriately tender to palpation. Midline incision clean and dry with honeycomb dressing in place. Colostomy pink and protuberant with mild edema, gas in bag. Extremities: warm and well-perfused   Lab Results:  Recent Labs    10/22/23 2001 10/24/23 0657  WBC 4.9 8.7  HGB 12.4* 11.6*  HCT 38.1* 37.2*  PLT 255 247   BMET Recent Labs    10/22/23 2001 10/24/23 0657  NA 135 134*  K 3.8 3.7  CL 101 102  CO2 25 23  GLUCOSE 104* 109*  BUN 17 9  CREATININE 0.78 0.71  CALCIUM 9.2 8.3*   PT/INR No results for input(s): LABPROT, INR in the last 72 hours. CMP     Component Value Date/Time   NA 134 (L) 10/24/2023 0657   K 3.7 10/24/2023 0657   CL 102 10/24/2023 0657   CO2 23 10/24/2023 0657   GLUCOSE 109 (H) 10/24/2023 0657   BUN 9 10/24/2023 0657   CREATININE 0.71 10/24/2023 0657   CALCIUM 8.3 (L) 10/24/2023 0657   PROT 8.0 10/22/2023 2001   ALBUMIN 4.0 10/22/2023 2001   AST 50 (H) 10/22/2023 2001   ALT 62 (H) 10/22/2023 2001   ALKPHOS 58 10/22/2023 2001   BILITOT 1.0 10/22/2023 2001   GFRNONAA >60 10/24/2023 0657   GFRAA >60 12/27/2019 1557   Lipase      Component Value Date/Time   LIPASE 25 10/22/2023 2001       Studies/Results: CT ABDOMEN PELVIS W CONTRAST Result Date: 10/23/2023 CLINICAL DATA:  Left lower quadrant pain. EXAM: CT ABDOMEN AND PELVIS WITH CONTRAST TECHNIQUE: Multidetector CT imaging of the abdomen and pelvis was performed using the standard protocol following bolus administration of intravenous contrast. RADIATION DOSE REDUCTION: This exam was performed according to the departmental dose-optimization program which includes automated exposure control, adjustment of the mA and/or kV according to patient size and/or use of iterative reconstruction technique. CONTRAST:  OMNIPAQUE  IOHEXOL  300 MG/ML  SOLN COMPARISON:  None Available. FINDINGS: Lower chest: No acute abnormality. Hepatobiliary: No focal liver abnormality is seen. No gallstones, gallbladder wall thickening, or biliary dilatation. Pancreas: Unremarkable. No pancreatic ductal dilatation or surrounding inflammatory changes. Spleen: Normal in size without focal abnormality. Adrenals/Urinary Tract: Adrenal glands are unremarkable. Kidneys are normal, without renal calculi, focal lesion, or hydronephrosis. Urinary bladder is poorly distended and subsequently limited in evaluation. Stomach/Bowel: Stomach is within normal limits. Appendix appears normal. The small bowel loops throughout the abdomen are collapsed and subsequently limited in evaluation. A redundant transverse colon is seen. A moderate to large amount of stool is seen throughout distended, air-filled loops of large bowel. An abrupt transition zone is seen along an area of mesenteric twisting that is present within the lower  abdomen, along the midline (best seen on coronal reformatted images 60-76, CT series 8). It should be noted that the sigmoid colon is poorly visualized. Vascular/Lymphatic: No significant vascular findings are present. No enlarged abdominal or pelvic lymph nodes. Reproductive: The prostate gland is  mildly enlarged and heterogeneous in appearance. Other: No abdominal wall hernia or abnormality. A moderate amount of pelvic free fluid is seen. Musculoskeletal: No acute or significant osseous findings. IMPRESSION: 1. Area of mesenteric twisting within the lower abdomen with subsequent distal large bowel obstruction, as described above. Electronically Signed   By: Suzen Dials M.D.   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 - Advance to full liquids, continue advancing diet as tolerated to soft diet - SLIV - Multimodal pain control: tylenol , robaxin, toradol , prn oxycodone  and dilaudid - Remove foley - Ambulate - WOC consult placed for ostomy teaching - VTE: lovenox, SCDs - Dispo: inpatient, med-surg floor    LOS: 1 day    Leonor Dawn, MD [facility] Surgery General, Hepatobiliary and Pancreatic Surgery 10/24/23 8:24 AM

## 2023-10-24 NOTE — Plan of Care (Signed)

## 2023-10-25 ENCOUNTER — Other Ambulatory Visit: Payer: Self-pay

## 2023-10-25 NOTE — TOC Initial Note (Signed)
 Transition of Care [facility]) - Initial/Assessment Note    Patient Details  Name: Robert Duffy MRN: [id number] Date of Birth: 02-19-1981  [facility]) CM/SW Contact:    NORMAN ASPEN, LCSW Phone Number: 10/25/2023, 2:21 PM  Clinical Narrative:                  Met with pt today to discuss anticipated dc planning needs.  Pt very pleasant and talks openly with CSW.  He confirms that he is homeless and has been staying in a shed behind a home on [address].  He is unsure of the address and explains that the person in the home is just letting me use it... not helping me....  Confirmed that the address on record is old and asks that it be removed - done.  He confirms contact person listed, Harlene Public is a cousin but does not want to ask for help because she has opinions about my habits....  Pt confirms that he is without a PCP.  Explained/ discussed with pt that having a new colostomy is going to require medical oversight and chronic care.  Need to have an address where supplies can be sent and a clean environment to prevent infection.  He understands this.  He does not have access to reliable transport for OP wound care if that is recommended.    Have alerted treatment team to his housing situation.  Working with [facility] supervisor to see if a temporary plan can be offered (I.e. motel).  Doubtful that SNF would be needed.  Continue to follow.    Expected Discharge Plan:  (TBD) Barriers to Discharge: Continued Medical Work up, Inadequate or no insurance, Unsafe home situation, Homeless with medical needs   Patient Goals and CMS Choice Patient states their goals for this hospitalization and ongoing recovery are:: a safe dc with new colostomy          Expected Discharge Plan and Services In-house Referral: Clinical Social Work Discharge Planning Services: [facility]   Living arrangements for the past 2 months: Homeless Shelter, No permanent address (currently in  a shed behind a home on [address].)                                      Prior Living Arrangements/Services Living arrangements for the past 2 months: Homeless Shelter, No permanent address (currently in a shed behind a home on [address] Lives with:: Self Patient language and need for interpreter reviewed:: Yes Do you feel safe going back to the place where you live?: Yes (but concerned)      Need for Family Participation in Patient Care: Yes (Comment) Care giver support system in place?: No (comment)   Criminal Activity/Legal Involvement Pertinent to Current Situation/Hospitalization: No - Comment as needed  Activities of Daily Living   ADL Screening (condition at time of admission) Independently performs ADLs?: Yes (appropriate for developmental age) Is the patient deaf or have difficulty hearing?: No Does the patient have difficulty seeing, even when wearing glasses/contacts?: No Does the patient have difficulty concentrating, remembering, or making decisions?: No  Permission Sought/Granted                  Emotional Assessment Appearance:: Appears stated age Attitude/Demeanor/Rapport: Engaged, Gracious Affect (typically observed): Accepting, Pleasant, Quiet Orientation: : Oriented to Self, Oriented to Place, Oriented to  Time, Oriented to Situation Alcohol /  Substance Use: Alcohol Use Psych Involvement: No (comment)  Admission diagnosis:  Large bowel obstruction (HCC) [K56.609] Sigmoid volvulus (HCC) [K56.2] Patient Active Problem List   Diagnosis Date Noted   Large bowel obstruction (HCC) 10/23/2023   Sigmoid volvulus (HCC) 10/23/2023   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   Anxiety state 08/07/2023   Mandible fracture (HCC) 04/06/2021   Opiate overdose, accidental or unintentional, initial encounter (HCC) 04/06/2021   PCP:  Pcp, No Pharmacy:   [id number] - [place], [place] - [address] AT [facility] OF  [address] [place] [address] Phone: [phone] Fax: [phone]     Social Drivers of Health (SDOH) Social History: SDOH Screenings   Food Insecurity: No Food Insecurity (10/23/2023)  Housing: High Risk (10/23/2023)  Transportation Needs: Unmet Transportation Needs (10/23/2023)  Utilities: Not At Risk (10/23/2023)  Tobacco Use: High Risk (10/22/2023)   SDOH Interventions: Housing Interventions: Inpatient TOC Transportation Interventions: Inpatient TOC   Readmission Risk Interventions     No data to display

## 2023-10-25 NOTE — Progress Notes (Signed)
    2 Days Post-Op  Subjective: No acute issues overnight. Per patient pain controlled unless moving. Passing flatus from colostomy. Denies nausea.   Objective: Vital signs in last 24 hours: Temp:  [97.8 F (36.6 C)-98.4 F (36.9 C)] 98 F (36.7 C) (06/30 0512) Pulse Rate:  [58-66] 58 (06/30 0512) Resp:  [15-18] 16 (06/30 0512) BP: (108-109)/(70-72) 109/72 (06/30 0512) SpO2:  [97 %-98 %] 97 % (06/30 0512) Weight:  [81.9 kg] 81.9 kg (06/30 0715) Last BM Date : 10/22/23  Intake/Output from previous day: 06/29 0701 - 06/30 0700 In: 1315 [P.O.:1315] Out: 1025 [Urine:1000; Stool:25] Intake/Output this shift: No intake/output data recorded.  PE: General: resting comfortably, NAD Abdomen: soft, nondistended, appropriately tender to palpation. Midline incision clean and dry. Colostomy beefy red and edematous, gas in bag. Extremities: warm and well-perfused   Lab Results:  Recent Labs    10/22/23 2001 10/24/23 0657  WBC 4.9 8.7  HGB 12.4* 11.6*  HCT 38.1* 37.2*  PLT 255 247   BMET Recent Labs    10/22/23 2001 10/24/23 0657  NA 135 134*  K 3.8 3.7  CL 101 102  CO2 25 23  GLUCOSE 104* 109*  BUN 17 9  CREATININE 0.78 0.71  CALCIUM 9.2 8.3*   PT/INR No results for input(s): LABPROT, INR in the last 72 hours. CMP     Component Value Date/Time   NA 134 (L) 10/24/2023 0657   K 3.7 10/24/2023 0657   CL 102 10/24/2023 0657   CO2 23 10/24/2023 0657   GLUCOSE 109 (H) 10/24/2023 0657   BUN 9 10/24/2023 0657   CREATININE 0.71 10/24/2023 0657   CALCIUM 8.3 (L) 10/24/2023 0657   PROT 8.0 10/22/2023 2001   ALBUMIN 4.0 10/22/2023 2001   AST 50 (H) 10/22/2023 2001   ALT 62 (H) 10/22/2023 2001   ALKPHOS 58 10/22/2023 2001   BILITOT 1.0 10/22/2023 2001   GFRNONAA >60 10/24/2023 0657   GFRAA >60 12/27/2019 1557   Lipase     Component Value Date/Time   LIPASE 25 10/22/2023 2001       Studies/Results: No results found.      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 - continue soft diet as tolerated - SLIV - Multimodal pain control: tylenol , robaxin, toradol , prn oxycodone  and dilaudid - Ambulate - WOC consult placed for ostomy teaching - VTE: lovenox, SCDs - Dispo: inpatient, med-surg floor    LOS: 2 days    Bernarda JAYSON Ned, MD  Colorectal and General Surgery [facility]

## 2023-10-25 NOTE — Consult Note (Signed)
 WOC Nurse ostomy consult note Stoma type/location: LLQ colostomy. Stomal assessment/size: 55 mm round, swelling, red and viable, above the skin level. Peristomal assessment: intact, the bag was leaking, constructed the ostomy close to iliac crest. Treatment options for stomal/peristomal skin: belt #621. Output 30 ml of liquid stools at 1040. Ostomy pouching: 1pc. #[id number]  Education provided: Provide ostomy teaching for the patient, he was alert, follow the instructions and did hand on. - Change the pouch twice a week or if is leaking. - Empty the pouch when is 1/3 full. - Take the old bag gently off the skin. - Clean with water and soup if is necessary, the skin should be clean and dry for the next pouch. - Cut as the ostomy size and shape. - Take the plastic protection off, that can be a model for the next cutting. - Apply on the skin, matching the bottom first and stretching a little the skin for a good fit.  Enrolled patient in [employer] DC program: Yes today.  Leave folder for ostomy teaching and given instructions about how to get ostomy supplies.  WOC team will follow THURS.   Please reconsult if further assistance is needed. Thank-you,  Lela Holm BSN, RN, [employer], WOC  (Pager: [phone])

## 2023-10-25 NOTE — Plan of Care (Signed)

## 2023-10-26 NOTE — Progress Notes (Signed)
    3 Days Post-Op  Subjective: No acute issues overnight. Per patient pain controlled unless moving. Passing flatus and stool from colostomy. Has emptied the ostomy pouch independently, but has not changed it himself yet.   Says he has not found any family/friends he can stay with - per his report they all have a full house or have said no, he cannot stay there.  Objective: Vital signs in last 24 hours: Temp:  [97.9 F (36.6 C)-98.1 F (36.7 C)] 98.1 F (36.7 C) (07/01 0537) Pulse Rate:  [51-59] 51 (07/01 0537) Resp:  [18] 18 (07/01 0537) BP: (111-120)/(74-80) 111/74 (07/01 0537) SpO2:  [96 %-98 %] 98 % (07/01 0537) Last BM Date : 10/22/23  Intake/Output from previous day: 06/30 0701 - 07/01 0700 In: 960 [P.O.:960] Out: 25 [Stool:25] Intake/Output this shift: Total I/O In: 240 [P.O.:240] Out: 0   PE: General: resting comfortably, NAD Abdomen: soft, nondistended, appropriately tender to palpation. Midline incision clean and dry. Colostomy beefy red and edematous, gas  and scant liquid stool in pouch Extremities: warm and well-perfused   Lab Results:  Recent Labs    10/24/23 0657  WBC 8.7  HGB 11.6*  HCT 37.2*  PLT 247   BMET Recent Labs    10/24/23 0657  NA 134*  K 3.7  CL 102  CO2 23  GLUCOSE 109*  BUN 9  CREATININE 0.71  CALCIUM 8.3*   PT/INR No results for input(s): LABPROT, INR in the last 72 hours. CMP     Component Value Date/Time   NA 134 (L) 10/24/2023 0657   K 3.7 10/24/2023 0657   CL 102 10/24/2023 0657   CO2 23 10/24/2023 0657   GLUCOSE 109 (H) 10/24/2023 0657   BUN 9 10/24/2023 0657   CREATININE 0.71 10/24/2023 0657   CALCIUM 8.3 (L) 10/24/2023 0657   PROT 8.0 10/22/2023 2001   ALBUMIN 4.0 10/22/2023 2001   AST 50 (H) 10/22/2023 2001   ALT 62 (H) 10/22/2023 2001   ALKPHOS 58 10/22/2023 2001   BILITOT 1.0 10/22/2023 2001   GFRNONAA >60 10/24/2023 0657   GFRAA >60 12/27/2019 1557   Lipase     Component Value Date/Time    LIPASE 25 10/22/2023 2001       Studies/Results: No results found.      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 - continue soft diet as tolerated - Multimodal pain control: tylenol , robaxin, toradol , prn oxycodone  and dilaudid - Ambulate - WOC consult placed for ostomy teaching; he is working with them but needs to become more independent with pouch changes prior to discharge - VTE: lovenox, SCDs - Dispo: inpatient, med-surg floor Appreciate TOC assistance with dispo, at present the patient lives in a shed, does not have access to a bathroom or a shower. Does not have reliable transportation.    LOS: 3 days   Almarie Pringle, [facility] Surgery Please see Amion for pager number during day hours 7:00am-4:30pm

## 2023-10-27 LAB — SURGICAL PATHOLOGY

## 2023-10-27 NOTE — TOC Progression Note (Signed)
 Transition of Care [facility]) - Progression Note    Patient Details  Name: Robert Duffy MRN: [id number] Date of Birth: 01-Jan-1981  [facility]) CM/SW Contact  NORMAN ASPEN, LCSW Phone Number: 10/27/2023, 3:37 PM  Clinical Narrative:     Updated patient today on arrangements being worked on to set up safe dc environment.  Working with [employer] director to secure funding for pt to possibly have a two week stay in a motel.  Continue to discuss best way for pt to receive ongoing colostomy supplies once he leaves the motel - possibly through [facility] or [facility]? Pt understands what is being done and appreciative.   Pt continues to work on independence with colostomy care and may be ready for dc 1-2 days if medically clear.  Will keep team posted.  Expected Discharge Plan:  (TBD) Barriers to Discharge: Continued Medical Work up, Inadequate or no insurance, Unsafe home situation, Homeless with medical needs  Expected Discharge Plan and Services In-house Referral: Clinical Social Work Discharge Planning Services: Indigent Health Clinic   Living arrangements for the past 2 months: Homeless Shelter, No permanent address (currently in a shed behind a home on [address].)                                       Social Determinants of Health (SDOH) Interventions SDOH Screenings   Food Insecurity: No Food Insecurity (10/23/2023)  Housing: High Risk (10/23/2023)  Transportation Needs: Unmet Transportation Needs (10/23/2023)  Utilities: Not At Risk (10/23/2023)  Tobacco Use: High Risk (10/22/2023)    Readmission Risk Interventions     No data to display

## 2023-10-27 NOTE — Plan of Care (Signed)
   Problem: Education: Goal: Knowledge of General Education information will improve Description Including pain rating scale, medication(s)/side effects and non-pharmacologic comfort measures Outcome: Progressing   Problem: Health Behavior/Discharge Planning: Goal: Ability to manage health-related needs will improve Outcome: Progressing

## 2023-10-27 NOTE — Progress Notes (Signed)
    4 Days Post-Op  Subjective: No acute issues overnight. Per patient pain controlled unless moving. Having ostomy output and mobilizing  Objective: Vital signs in last 24 hours: Temp:  [97.7 F (36.5 C)-98.5 F (36.9 C)] 97.7 F (36.5 C) (07/02 0558) Pulse Rate:  [60-65] 65 (07/01 2034) Resp:  [16-18] 16 (07/02 0558) BP: (110-121)/(64-82) 117/80 (07/02 0558) SpO2:  [98 %-100 %] 99 % (07/02 0558) Last BM Date : 10/27/23  Intake/Output from previous day: 07/01 0701 - 07/02 0700 In: 1200 [P.O.:1200] Out: 0  Intake/Output this shift: Total I/O In: 360 [P.O.:360] Out: 0   PE: General: resting comfortably in the chair, NAD Abdomen: soft, nondistended, appropriately tender to palpation. Midline incision clean and dry. Colostomy beefy red and edematous, gas  and stool in pouch Extremities: warm and well-perfused   Lab Results:  No results for input(s): WBC, HGB, HCT, PLT in the last 72 hours.  BMET No results for input(s): NA, K, CL, CO2, GLUCOSE, BUN, CREATININE, CALCIUM in the last 72 hours.  PT/INR No results for input(s): LABPROT, INR in the last 72 hours. CMP     Component Value Date/Time   NA 134 (L) 10/24/2023 0657   K 3.7 10/24/2023 0657   CL 102 10/24/2023 0657   CO2 23 10/24/2023 0657   GLUCOSE 109 (H) 10/24/2023 0657   BUN 9 10/24/2023 0657   CREATININE 0.71 10/24/2023 0657   CALCIUM 8.3 (L) 10/24/2023 0657   PROT 8.0 10/22/2023 2001   ALBUMIN 4.0 10/22/2023 2001   AST 50 (H) 10/22/2023 2001   ALT 62 (H) 10/22/2023 2001   ALKPHOS 58 10/22/2023 2001   BILITOT 1.0 10/22/2023 2001   GFRNONAA >60 10/24/2023 0657   GFRAA >60 12/27/2019 1557   Lipase     Component Value Date/Time   LIPASE 25 10/22/2023 2001       Studies/Results: No results found.      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 - continue soft diet as tolerated - path negative for  dysplasia or malignancy. Showed serositis  - Multimodal pain control: tylenol , robaxin, toradol , prn oxycodone  and dilaudid - Ambulate - WOC consult placed for ostomy teaching; he is working with them but needs to become more independent with pouch changes prior to discharge - VTE: lovenox, SCDs - Dispo: inpatient, med-surg floor Appreciate TOC assistance with dispo, at present the patient lives in a shed, does not have access to a bathroom or a shower. Does not have reliable transportation.    LOS: 4 days   Almarie Pringle, [facility] Please see Amion for pager number during day hours 7:00am-4:30pm

## 2023-10-28 ENCOUNTER — Other Ambulatory Visit (HOSPITAL_COMMUNITY): Payer: Self-pay

## 2023-10-28 MED ORDER — ACETAMINOPHEN 500 MG PO TABS
1000.0000 mg | ORAL_TABLET | Freq: Four times a day (QID) | ORAL | 0 refills | Status: DC | PRN
  Filled 2023-10-28: qty 40, 5d supply, fill #0

## 2023-10-28 MED ORDER — METHOCARBAMOL 750 MG PO TABS: 750.0000 mg | ORAL_TABLET | Freq: Four times a day (QID) | ORAL | 0 refills | Status: DC | PRN

## 2023-10-28 MED ORDER — DOCUSATE SODIUM 100 MG PO CAPS
100.0000 mg | ORAL_CAPSULE | Freq: Two times a day (BID) | ORAL | 0 refills | Status: AC
  Filled 2023-10-28: qty 10, 5d supply, fill #0

## 2023-10-28 MED ORDER — IBUPROFEN 200 MG PO TABS
600.0000 mg | ORAL_TABLET | Freq: Three times a day (TID) | ORAL | 0 refills | Status: DC | PRN
  Filled 2023-10-28: qty 20, 2d supply, fill #0

## 2023-10-28 MED ORDER — IBUPROFEN 200 MG PO TABS: 600.0000 mg | ORAL_TABLET | Freq: Three times a day (TID) | ORAL | 0 refills | Status: DC | PRN

## 2023-10-28 MED ORDER — DOCUSATE SODIUM 100 MG PO CAPS: 100.0000 mg | ORAL_CAPSULE | Freq: Two times a day (BID) | ORAL | 0 refills | Status: DC

## 2023-10-28 MED ORDER — METHOCARBAMOL 750 MG PO TABS
750.0000 mg | ORAL_TABLET | Freq: Four times a day (QID) | ORAL | 0 refills | Status: DC | PRN
  Filled 2023-10-28: qty 30, 8d supply, fill #0

## 2023-10-28 MED ORDER — ACETAMINOPHEN 500 MG PO TABS: 1000.0000 mg | ORAL_TABLET | Freq: Four times a day (QID) | ORAL | 0 refills | Status: DC | PRN

## 2023-10-28 NOTE — TOC Transition Note (Signed)
 Transition of Care [facility]) - Discharge Note   Patient Details  Name: Robert Duffy MRN: [id number] Date of Birth: Apr 18, 1981  Transition of Care [facility]) CM/SW Contact:  NORMAN ASPEN, LCSW Phone Number: 10/28/2023, 5:03 PM   Clinical Narrative:     Pt cleared medically for discharge today and support services/ plan in place.  Have been able to secure a room at local [facility] [place] for ~ 2 weeks (location on [address] in [place]).  He has been provided with gift card for food and bus passes to use for follow up medical appointments and transport to [facility].  We have arranged for his ostomy supplies to be delivered to the [facility] location and attn: to Aloysius Ford, RN (she is with the [facility] program).  We have reviewed all arrangements with patient and he states understanding of the importance to make all follow up appointments.  Taxi transport will take pt to hotel once [facility] Director, Cleatrice Sprang, confirms with RN that room has been paid/ secured today.  Final next level of care: Other (comment) (Extended Stay motel) Barriers to Discharge: Barriers Resolved   Patient Goals and CMS Choice Patient states their goals for this hospitalization and ongoing recovery are:: a safe dc with new colostomy          Discharge Placement                       Discharge Plan and Services Additional resources added to the After Visit Summary for   In-house Referral: Clinical Social Work Discharge Planning Services: [facility]igent [facility] Clinic                                 Social Drivers of Health (SDOH) Interventions SDOH Screenings   Food Insecurity: No Food Insecurity (10/23/2023)  Housing: High Risk (10/23/2023)  Transportation Needs: Unmet Transportation Needs (10/23/2023)  Utilities: Not At Risk (10/23/2023)  Tobacco Use: High Risk (10/22/2023)     Readmission Risk Interventions    10/28/2023    5:02 PM  Readmission Risk Prevention Plan  Transportation  Screening Complete  PCP or Specialist Appt within 5-7 Days Complete  Home Care Screening Complete  Medication Review (RN CM) Complete

## 2023-10-28 NOTE — Plan of Care (Signed)

## 2023-10-28 NOTE — Discharge Instructions (Signed)
 [facility]      [facility], [place] [phone]  OPEN ABDOMINAL SURGERY: POST OP INSTRUCTIONS  Always review your discharge instruction sheet given to you by the facility where your surgery was performed.  IF YOU HAVE DISABILITY OR FAMILY LEAVE FORMS, YOU MUST BRING THEM TO THE OFFICE FOR PROCESSING.  PLEASE DO NOT GIVE THEM TO YOUR DOCTOR.  A prescription for pain medication may be given to you upon discharge.  Take your pain medication as prescribed, if needed.  If narcotic pain medicine is not needed, then you may take acetaminophen (Tylenol) or ibuprofen (Advil) as needed. Take your usually prescribed medications unless otherwise directed. If you need a refill on your pain medication, please contact your pharmacy. They will contact our office to request authorization.  Prescriptions will not be filled after 5pm or on week-ends. You should follow a light diet the first few days after arrival home, such as soup and crackers, pudding, etc.unless your doctor has advised otherwise. A high-fiber, low fat diet can be resumed as tolerated.   Be sure to include lots of fluids daily. Most patients will experience some swelling and bruising on the chest and neck area.  Ice packs will help.  Swelling and bruising can take several days to resolve Most patients will experience some swelling and bruising in the area of the incision. Ice pack will help. Swelling and bruising can take several days to resolve..  It is common to experience some constipation if taking pain medication after surgery.  Increasing fluid intake and taking a stool softener will usually help or prevent this problem from occurring.  A mild laxative (Milk of Magnesia or Miralax) should be taken according to package directions if there are no bowel movements after 48 hours.  You may have steri-strips (small skin tapes) in place directly over the incision.  These strips should be left on the skin for 7-10 days.  If your surgeon used skin  glue on the incision, you may shower in 24 hours.  The glue will flake off over the next 2-3 weeks.  Any sutures or staples will be removed at the office during your follow-up visit. You may find that a light gauze bandage over your incision may keep your staples from being rubbed or pulled. You may shower and replace the bandage daily. ACTIVITIES:  You may resume regular (light) daily activities beginning the next day--such as daily self-care, walking, climbing stairs--gradually increasing activities as tolerated.  You may have sexual intercourse when it is comfortable.  Refrain from any heavy lifting or straining until approved by your doctor. You may drive when you no longer are taking prescription pain medication, you can comfortably wear a seatbelt, and you can safely maneuver your car and apply brakes Return to Work: ___________________________________ Bonita Quin should see your doctor in the office for a follow-up appointment approximately two weeks after your surgery.  Make sure that you call for this appointment within a day or two after you arrive home to insure a convenient appointment time. OTHER INSTRUCTIONS:  _____________________________________________________________ _____________________________________________________________  WHEN TO CALL YOUR DOCTOR: Fever over 101.0 Inability to urinate Nausea and/or vomiting Extreme swelling or bruising Continued bleeding from incision. Increased pain, redness, or drainage from the incision. Difficulty swallowing or breathing Muscle cramping or spasms. Numbness or tingling in hands or feet or around lips.  The clinic staff is available to answer your questions during regular business hours.  Please don't hesitate to call and ask to speak to one of  the nurses if you have concerns.  For further questions, please visit [email or web address]

## 2023-10-28 NOTE — Consult Note (Signed)
 WOC Nurse ostomy follow up Stoma type/location: LLQ colostomy. Stomal assessment/size: 51 mm round, swelling, dark red and viable, above the skin level. Peristomal assessment: intact, blister on 11 and 7 o'clock position, constructed the ostomy close to iliac crest. Treatment options for stomal/peristomal skin: belt #621 and ring [id number]. Output 50 ml of soft brown stools at 0820. Ostomy pouching: 1pc. #[id number] and ring 2  Education provided: Provide ostomy teaching for the patient, he was alert, follow the instructions and did hand on. - Change the pouch twice a week or if is leaking. - Empty the pouch when is 1/3 full. - Take the old bag gently off the skin. - Clean with water and soup if is necessary, the skin should be clean and dry for the next pouch. - Cut as the ostomy size and shape. The pt cut the ostomy bag. - Take the plastic protection off, that can be a model for the next cutting. - Teach how to apply the ring, making a collar of protection surrounding the ostomy, that can be apply directly on his skin or on the waffle. - Apply the bag, matching the bottom first and stretching a little the skin for a good fit.   Enrolled patient in [employer] DC program: No. Waiting the address to delivery the supplies.   Leave folder for ostomy teaching and given instructions about how to get ostomy supplies.  WOC team will follow MON.   Please reconsult if further assistance is needed. Thank-you,  Lela Holm BSN, RN, [employer], WOC  (Pager: [phone])

## 2023-10-28 NOTE — Progress Notes (Signed)
 Taxi was called for patient and patient taken down to the main entrance via wheelchair. Taxi voucher was given to driver.

## 2023-10-28 NOTE — Progress Notes (Signed)
 Pt provided discharge instructions, ostomy supplies, room key to hotel, $100 [employer] gift card, and bus passes from [facility]. Pt awaiting transport via taxi to hotel.

## 2023-10-28 NOTE — Progress Notes (Signed)
 TOC director dropped off $100 [employer] gift card and keys to hotel. This nurse gave above along with discharge packet.

## 2023-10-28 NOTE — Discharge Summary (Addendum)
 [facility] Discharge Summary   Patient ID: Robert Duffy MRN: [id number] DOB/AGE: 44-Mar-1982 44 y.o.  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  Procedures Dr. Leonor Dawn (10/23/2023) -  Procedure:  Diagnostic laparoscopy Open sigmoid colectomy with end colostomy   HPI: Robert Duffy is a 43 yo male who presented to the ED with acute onset abdominal pain. The pain started early this morning and is mostly in the lower abdomen. It occurs in episodes every few minutes, with a cramping sensation. He has tried to have bowel movements but has only been able to pass some mucous. He is not passing flatus. WBC is normal and remaining labs are unremarkable. A CT scan showed a mesenteric swirl with concern for obstruction of the distal colon. General surgery was consulted.   He has not had any previous abdominal surgeries and is otherwise in good health.  Hospital Course:  The patient was admitted for further management of LBO. He underwent the above operation by Dr .Dawn. He tolerated the procedure well. Diet was gradually advanced as tolerated. The patient worked with wound ostomy care RN's and was able to perform his own ostomy care. On 10/28/23 the patients vitals were stable, pain controlled, tolerating PO, mobilizing, and stable for discharge home. Prior to coming into the hospital the patient reported he was living in a shed without a bathroom or shower. Case Management was consulted and was able to secure a room at a hotel for two  weeks, along with gift card for food,  bus passes to use for follow up medical appointments. Follow up in our office as below.   I have personally reviewed the patients medication history on the [place] controlled substance database.    Physical Exam: General:  Alert, NAD, pleasant, comfortable Abd:  Soft, ND, mild tenderness, incisions C/D/I, ostomy productive of stool, stoma viable and pink.  Allergies as of 10/28/2023       Reactions   Other Shortness Of Breath, Itching, Other (See Comments)   Cannot tolerate strong smells- Car exhaust, Clorox, etc... Itchy eyes, other allergic symptoms, etc..        Medication List     STOP taking these medications    doxycycline  100 MG capsule Commonly known as: VIBRAMYCIN        TAKE these medications    Acetaminophen  Extra Strength 500 MG Tabs Take 2 tablets (1,000 mg total) by mouth every 6 (six) hours as needed.   docusate sodium  100 MG capsule Commonly known as: COLACE Take 1 capsule (100 mg total) by mouth 2 (two) times daily.   famotidine  20 MG tablet Commonly known as: PEPCID  Take 1 tablet (20 mg total) by mouth 2 (two) times daily.   ibuprofen  200 MG tablet Commonly known as: ADVIL  Take 3 tablets (600 mg total) by mouth every 8 (eight) hours as needed for mild pain (pain score 1-3) or moderate pain (pain score 4-6).   methocarbamol  750 MG tablet Commonly known as: ROBAXIN  Take 1 tablet (750 mg total) by mouth every 6 (six) hours as needed (use for muscle cramps/pain).   pantoprazole  40  MG tablet Commonly known as: Protonix  Take 1 tablet (40 mg total) by mouth daily for 28 days.          Follow-up Information     [facility]. Go on 11/08/2023.   Specialty: General Surgery Why: at 10:00AM for staple removal by a nurse. please arrive 30 minutes early. Contact information: [address] [place] [address] [phone]         Dasie Leonor CROME, MD Follow up on 11/17/2023.   Specialty:  General Surgery Why: at 10:20 AM for post-oeprative follow up with your surgeon. please arrive 15-20 minutes early. Contact information: [address] [place] [address] [phone]         Aloysius Ford, RN at [facility] Follow up on 11/04/2023.   Why: 11/04/23 at 9:00 am - please arrive to meet with Aloysius Ford, RN for follow up and colostomy supplies                Signed: Almarie Pringle, [facility] 11/01/2023, 2:13 PM

## 2023-10-28 NOTE — Plan of Care (Signed)

## 2023-10-28 NOTE — Progress Notes (Signed)
 TOC meds in a secure bag delivered to inpatient pharmacy by this RN

## 2023-11-04 NOTE — [facility] Nurse Program (Signed)
 Client to RN office for scheduled appointment. He was recently referred by the hospital to help with resources post discharge from hospital. He has a new ostomy that he has been taking care of without complaint. RN was able to deliver client ostomy supplies to client and hold some supplies in office for client to assist. He is appreciative of service. RN also assisted with getting client connected to PCP and he has an appointment schedule for Monday July 14 at [facility] 1:20 pm. Bus passes were given to client to assist. Client also requesting help with housing and [facility] voucher so he could get a new ID. RN able to assist and get him connected to a CSW. He will follow up with RN next Thursday if needed. No further concerns at this time.

## 2023-11-08 ENCOUNTER — Inpatient Hospital Stay: Payer: Self-pay | Admitting: Nurse Practitioner

## 2023-11-11 NOTE — [facility]regational Nurse Program (Signed)
 Client to RN office for appointment. He needs a refill of bags for his colostomy and RN helped him with number to call PCP to reschedule appointment with [facility]. He also has two appointments next week and RN gave bus passes to help assist client. He has medicine and no needs there. RN was able to give client remaining colostomy bags stored in office for client. No further concerns at this time. RN will follow up with client next Thursday if client is available.

## 2023-11-18 ENCOUNTER — Telehealth: Admitting: Nurse Practitioner

## 2023-11-18 ENCOUNTER — Other Ambulatory Visit (HOSPITAL_COMMUNITY): Payer: Self-pay

## 2023-11-18 MED ORDER — OSTOMY SUPPLIES POUCH MISC
1.0000 | 5 refills | Status: AC
  Filled 2023-11-18: qty 10, fill #0

## 2023-11-18 MED ORDER — OSTOMY SUPPLIES KIT
1.0000 | PACK | 5 refills | Status: AC
  Filled 2023-11-18: qty 10, fill #0

## 2023-11-18 MED ORDER — OSTOMY BELT LARGE MISC
1.0000 | Freq: Every day | 1 refills | Status: AC | PRN
  Filled 2023-11-18: qty 1, fill #0

## 2023-11-18 NOTE — Progress Notes (Signed)
 Patient was seen by KAROLYN Aloysius Ford at [facility] today. Recent discharge from [facility] after surgery: sigmoid was resected and an end descending colostomy  LLQ Colostomy stomal/peristomal skin: belt #621 and ring [id number]. Ostomy pouching: 1pc. #[id number] and ring 2    Patient presented to [facility] today requesting supply refill for ostomy care. He is currently homeless. He was given a two week hotel voucher at the time of discharge. He has been seeking care via the [facility].   His phone was shut off.   Presenting to help with coordination of care.  He does not have a PCP.   Patient was not available for appointment during scheduled clinic hours, he will return next week for evaluation- supplies will be ordered and request delivery to [facility] in an attempt to coordinate care with CCNP at follow up   1. 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  Dispense: 10 kit; Refill: 5 - Ostomy Supplies Pouch MISC; 1 Application by Does not apply route once a week.  Dispense: 10 Units; Refill: 5 - Ostomy Supplies (OSTOMY BELT LARGE) MISC; 1 kit by Does not apply route daily as needed.  Dispense: 1 each; Refill: 1

## 2023-11-18 NOTE — [facility] Nurse Program (Unsigned)
 New colostomy bags called [facility], RN had two in stock Sent to see community health worker Porscha to get assistance with SDOH needs Number given for PCP appointment to make new patient appointment, will also bridge PCP appointment with virtual PCP

## 2023-11-19 ENCOUNTER — Other Ambulatory Visit (HOSPITAL_COMMUNITY): Payer: Self-pay

## 2023-12-07 ENCOUNTER — Emergency Department (HOSPITAL_COMMUNITY)
Admission: EM | Admit: 2023-12-07 | Discharge: 2023-12-08 | Disposition: A | Discharge status: Home / Self Care | Attending: Emergency Medicine | Admitting: Emergency Medicine

## 2023-12-07 ENCOUNTER — Other Ambulatory Visit: Payer: Self-pay

## 2023-12-07 ENCOUNTER — Emergency Department (HOSPITAL_COMMUNITY)

## 2023-12-07 ENCOUNTER — Encounter (HOSPITAL_COMMUNITY): Payer: Self-pay

## 2023-12-07 DIAGNOSIS — R1084 Generalized abdominal pain: Secondary | ICD-10-CM | POA: Diagnosis present

## 2023-12-07 DIAGNOSIS — R079 Chest pain, unspecified: Secondary | ICD-10-CM | POA: Insufficient documentation

## 2023-12-07 LAB — CBC WITH DIFFERENTIAL/PLATELET
Abs Immature Granulocytes: 0.01 K/uL (ref 0.00–0.07)
Basophils Absolute: 0 K/uL (ref 0.0–0.1)
Basophils Relative: 0 %
Eosinophils Absolute: 0.2 K/uL (ref 0.0–0.5)
Eosinophils Relative: 3 %
HCT: 40.1 % (ref 39.0–52.0)
Hemoglobin: 13.1 g/dL (ref 13.0–17.0)
Immature Granulocytes: 0 %
Lymphocytes Relative: 32 %
Lymphs Abs: 2.2 K/uL (ref 0.7–4.0)
MCH: 28.2 pg (ref 26.0–34.0)
MCHC: 32.7 g/dL (ref 30.0–36.0)
MCV: 86.4 fL (ref 80.0–100.0)
Monocytes Absolute: 0.5 K/uL (ref 0.1–1.0)
Monocytes Relative: 7 %
Neutro Abs: 4 K/uL (ref 1.7–7.7)
Neutrophils Relative %: 58 %
Platelets: 364 K/uL (ref 150–400)
RBC: 4.64 MIL/uL (ref 4.22–5.81)
RDW: 12.9 % (ref 11.5–15.5)
WBC: 6.9 K/uL (ref 4.0–10.5)
nRBC: 0 % (ref 0.0–0.2)

## 2023-12-07 MED ORDER — FAMOTIDINE IN NACL 20-0.9 MG/50ML-% IV SOLN
20.0000 mg | Freq: Once | INTRAVENOUS | Status: AC
  Administered 2023-12-07 (×2): 20 mg via INTRAVENOUS
  Filled 2023-12-07: qty 50

## 2023-12-07 MED ORDER — CALCIUM CARBONATE ANTACID 500 MG PO CHEW
1.0000 | CHEWABLE_TABLET | Freq: Once | ORAL | Status: AC
  Administered 2023-12-07 (×2): 200 mg via ORAL
  Filled 2023-12-07: qty 1

## 2023-12-07 NOTE — ED Triage Notes (Signed)
 Pt BIB EMS for evaluation abd pain that began 45 minutes ago. Reports nausea, no emesis. ETOH +. Meth use around 4pm.

## 2023-12-07 NOTE — ED Provider Notes (Signed)
 [facility] Emergency Department Provider Note MRN:  [id number]  Arrival date & time: 12/08/23     Chief Complaint   Abdominal Pain   History of Present Illness   Robert Duffy is a 43 y.o. year-old male presents to the ED with chief complaint of chest pain and abdominal pain.  States that onset of symptoms was today, but worsened tonight.  States that it might have been from some baked beans.  States that his colostomy bag needs changing.  States that he had some burning and then stabbing, but thinks it might have been heart burn.   History provided by patient.   Review of Systems  Pertinent positive and negative review of systems noted in HPI.    Physical Exam   Vitals:   12/08/23 0321 12/08/23 0400  BP: 104/77 116/85  Pulse: 71 70  Resp: 11 10  Temp: 97.6 F (36.4 C)   SpO2: 95% 97%    CONSTITUTIONAL:  non toxic-appearing, NAD NEURO:  Alert and oriented x 3, CN 3-12 grossly intact EYES:  eyes equal and reactive ENT/NECK:  Supple, no stridor  CARDIO:  normal rate, regular rhythm, appears well-perfused  PULM:  No respiratory distress, CTAB GI/GU:  non-distended, colostomy in LLQ MSK/SPINE:  No gross deformities, no edema, moves all extremities  SKIN:  no rash, atraumatic   *Additional and/or pertinent findings included in MDM below  Diagnostic and Interventional Summary    EKG Interpretation Date/Time:    Ventricular Rate:    PR Interval:    QRS Duration:    QT Interval:    QTC Calculation:   R Axis:      Text Interpretation:         Labs Reviewed  COMPREHENSIVE METABOLIC PANEL WITH GFR - Abnormal; Notable for the following components:      Result Value   Glucose, Bld 118 (*)    Total Protein 8.4 (*)    AST 49 (*)    ALT 54 (*)    All other components within normal limits  LIPASE, BLOOD  CBC WITH DIFFERENTIAL/PLATELET  TROPONIN I (HIGH SENSITIVITY)  TROPONIN I (HIGH SENSITIVITY)    DG Chest Port 1 View  Final Result       Medications  famotidine  (PEPCID ) IVPB 20 mg premix (0 mg Intravenous Stopped 12/07/23 2355)  calcium  carbonate (TUMS - dosed in mg elemental calcium ) chewable tablet 200 mg of elemental calcium  (200 mg of elemental calcium  Oral Given 12/07/23 2329)     Procedures  /  Critical Care Procedures  ED Course and Medical Decision Making  I have reviewed the triage vital signs, the nursing notes, and pertinent available records from the EMR.  Social Determinants Affecting Complexity of Care: Patient has no clinically significant social determinants affecting this chief complaint..   ED Course: Clinical Course as of 12/08/23 0415  Wed Dec 08, 2023  0413 Comprehensive metabolic panel(!) No significant electrolyte abnormality [RB]  0413 CBC with Diff No leukocytosis or anemia [RB]  0413 Troponin I (High Sensitivity) Troponins are negative x 2, doubt ACS [RB]    Clinical Course User Index [RB] Vicky Charleston, PA-C    Medical Decision Making Patient here with complaints of chest pain and abdominal pain.  He states that he mainly feels like burning sensation, and associates it with having eaten baked beans before going to sleep.  Patient treated with IV Pepcid  and with Tums.  He states that he feels better now.  He also reports having  some alcohol and meth earlier today.  Additionally, patient requests a new colostomy bag.  He states that his has been leaking some.  Vitals are stable.  Labs are reassuring.  Symptoms seem most consistent with acid reflux.  His symptoms have resolved.  I doubt ACS given normal troponins, no acute ischemic changes on EKG.  I doubt PE, he is not tachycardic nor hypoxic.  Chest x-ray shows no evidence of pneumonia.  Given reassuring workup, will plan for discharge home.  He agrees with plan.  Amount and/or Complexity of Data Reviewed Labs: ordered. Decision-making details documented in ED Course. Radiology: ordered. ECG/medicine tests:  ordered.  Risk OTC drugs. Prescription drug management.         Consultants: No consultations were needed in caring for this patient.   Treatment and Plan: I considered admission due to patient's initial presentation, but after considering the examination and diagnostic results, patient will not require admission and can be discharged with outpatient follow-up.    Final Clinical Impressions(s) / ED Diagnoses     ICD-10-CM   1. Generalized abdominal pain  R10.84     2. Chest pain, unspecified type  R07.9       ED Discharge Orders     None         Discharge Instructions Discussed with and Provided to Patient:     Discharge Instructions      Your tests look good tonight.  I recommend that you follow-up with your regular doctor.  If your symptoms change or worsen, please return to the emergency department.       Vicky Charleston, PA-C 12/08/23 9584    Carita Senior, MD 12/08/23 [phone]

## 2023-12-08 LAB — COMPREHENSIVE METABOLIC PANEL WITH GFR
ALT: 54 U/L — ABNORMAL HIGH (ref 0–44)
AST: 49 U/L — ABNORMAL HIGH (ref 15–41)
Albumin: 4.1 g/dL (ref 3.5–5.0)
Alkaline Phosphatase: 62 U/L (ref 38–126)
Anion gap: 10 (ref 5–15)
BUN: 8 mg/dL (ref 6–20)
CO2: 27 mmol/L (ref 22–32)
Calcium: 9.5 mg/dL (ref 8.9–10.3)
Chloride: 98 mmol/L (ref 98–111)
Creatinine, Ser: 0.9 mg/dL (ref 0.61–1.24)
GFR, Estimated: >60 mL/min (ref >60)
Glucose, Bld: 118 mg/dL — ABNORMAL HIGH (ref 70–99)
Potassium: 3.9 mmol/L (ref 3.5–5.1)
Sodium: 135 mmol/L (ref 135–145)
Total Bilirubin: 1 mg/dL (ref 0.0–1.2)
Total Protein: 8.4 g/dL — ABNORMAL HIGH (ref 6.5–8.1)

## 2023-12-08 LAB — TROPONIN I (HIGH SENSITIVITY)
Troponin I (High Sensitivity): 3 ng/L (ref <18)
Troponin I (High Sensitivity): <2 ng/L (ref <18)

## 2023-12-08 LAB — LIPASE, BLOOD: Lipase: 24 U/L (ref 11–51)

## 2023-12-08 MED ORDER — KETOROLAC TROMETHAMINE 30 MG/ML IJ SOLN
30.0000 mg | Freq: Once | INTRAMUSCULAR | Status: AC
  Administered 2023-12-08 (×2): 30 mg via INTRAMUSCULAR
  Filled 2023-12-08: qty 1

## 2023-12-08 NOTE — Discharge Instructions (Signed)
 Your tests look good tonight.  I recommend that you follow-up with your regular doctor.  If your symptoms change or worsen, please return to the emergency department.

## 2023-12-17 ENCOUNTER — Emergency Department (HOSPITAL_COMMUNITY)

## 2023-12-17 ENCOUNTER — Emergency Department (HOSPITAL_COMMUNITY)
Admission: EM | Admit: 2023-12-17 | Discharge: 2023-12-18 | Disposition: A | Discharge status: Home / Self Care | Attending: Emergency Medicine | Admitting: Emergency Medicine

## 2023-12-17 DIAGNOSIS — F1910 Other psychoactive substance abuse, uncomplicated: Secondary | ICD-10-CM

## 2023-12-17 DIAGNOSIS — R079 Chest pain, unspecified: Secondary | ICD-10-CM | POA: Diagnosis not present

## 2023-12-17 DIAGNOSIS — F17210 Nicotine dependence, cigarettes, uncomplicated: Secondary | ICD-10-CM | POA: Insufficient documentation

## 2023-12-17 LAB — CBC WITH DIFFERENTIAL/PLATELET
Abs Immature Granulocytes: 0.01 K/uL (ref 0.00–0.07)
Basophils Absolute: 0 K/uL (ref 0.0–0.1)
Basophils Relative: 1 %
Eosinophils Absolute: 0.4 K/uL (ref 0.0–0.5)
Eosinophils Relative: 6 %
HCT: 40.5 % (ref 39.0–52.0)
Hemoglobin: 13.2 g/dL (ref 13.0–17.0)
Immature Granulocytes: 0 %
Lymphocytes Relative: 40 %
Lymphs Abs: 2.2 K/uL (ref 0.7–4.0)
MCH: 28.5 pg (ref 26.0–34.0)
MCHC: 32.6 g/dL (ref 30.0–36.0)
MCV: 87.5 fL (ref 80.0–100.0)
Monocytes Absolute: 0.5 K/uL (ref 0.1–1.0)
Monocytes Relative: 9 %
Neutro Abs: 2.4 K/uL (ref 1.7–7.7)
Neutrophils Relative %: 44 %
Platelets: 373 K/uL (ref 150–400)
RBC: 4.63 MIL/uL (ref 4.22–5.81)
RDW: 13 % (ref 11.5–15.5)
WBC: 5.5 K/uL (ref 4.0–10.5)
nRBC: 0 % (ref 0.0–0.2)

## 2023-12-17 LAB — COMPREHENSIVE METABOLIC PANEL WITH GFR
ALT: 55 U/L — ABNORMAL HIGH (ref 0–44)
AST: 49 U/L — ABNORMAL HIGH (ref 15–41)
Albumin: 4.2 g/dL (ref 3.5–5.0)
Alkaline Phosphatase: 67 U/L (ref 38–126)
Anion gap: 10 (ref 5–15)
BUN: 15 mg/dL (ref 6–20)
CO2: 26 mmol/L (ref 22–32)
Calcium: 9.3 mg/dL (ref 8.9–10.3)
Chloride: 98 mmol/L (ref 98–111)
Creatinine, Ser: 0.84 mg/dL (ref 0.61–1.24)
GFR, Estimated: >60 mL/min (ref >60)
Glucose, Bld: 79 mg/dL (ref 70–99)
Potassium: 3.8 mmol/L (ref 3.5–5.1)
Sodium: 134 mmol/L — ABNORMAL LOW (ref 135–145)
Total Bilirubin: 0.5 mg/dL (ref 0.0–1.2)
Total Protein: 9 g/dL — ABNORMAL HIGH (ref 6.5–8.1)

## 2023-12-17 LAB — ACETAMINOPHEN LEVEL: Acetaminophen (Tylenol), Serum: <10 ug/mL — ABNORMAL LOW (ref 10–30)

## 2023-12-17 LAB — TROPONIN I (HIGH SENSITIVITY): Troponin I (High Sensitivity): <2 ng/L (ref <18)

## 2023-12-17 LAB — ETHANOL: Alcohol, Ethyl (B): <15 mg/dL (ref <15)

## 2023-12-17 LAB — SALICYLATE LEVEL: Salicylate Lvl: <7 mg/dL — ABNORMAL LOW (ref 7.0–30.0)

## 2023-12-17 MED ORDER — LIDOCAINE VISCOUS HCL 2 % MT SOLN
15.0000 mL | Freq: Once | OROMUCOSAL | Status: AC
  Administered 2023-12-17: 15 mL via ORAL
  Filled 2023-12-17: qty 15

## 2023-12-17 MED ORDER — ALUM & MAG HYDROXIDE-SIMETH 200-200-20 MG/5ML PO SUSP
30.0000 mL | Freq: Once | ORAL | Status: AC
  Administered 2023-12-17: 30 mL via ORAL
  Filled 2023-12-17: qty 30

## 2023-12-17 NOTE — ED Provider Notes (Addendum)
 [place] EMERGENCY DEPARTMENT AT [facility] Provider Note  CSN: [id number] Arrival date & time: 12/17/23 8082  Chief Complaint(s) Drug Problem  HPI Robert Duffy is a 43 y.o. male history of methamphetamine abuse, large bowel obstruction status post colostomy presenting to the emergency department with chest pain.  Patient report sensation of burning in the chest and sore throat after using meth earlier.  Also has sensation of feeling like his close have a gasoline smell.  Does report that he is using methamphetamine.  Reports occasionally he may be hearing some vague whispering but not really hearing voices.  Denies any thoughts of self-harm or hurting others.  Reports that the symptoms are mild.  No difficulty breathing, abdominal pain.  Reports his ostomy has been functioning normally.  No fevers or chills.  No cough.  No runny nose or sore throat.   Past Medical History No past medical history on file. 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 Authorizing Provider  acetaminophen  (TYLENOL ) 500 MG tablet Take 2 tablets (1,000 mg total) by mouth every 6 (six) hours as needed. 10/28/23   [name], PA-C  docusate sodium  (COLACE) 100 MG capsule Take 1 capsule (100 mg total) by mouth 2 (two) times daily. 10/28/23   [name], PA-C  famotidine  (PEPCID ) 20 MG tablet Take 1 tablet (20 mg total) by mouth 2 (two) times daily. Patient not taking: Reported on 08/06/2023 07/18/20   Geiple, Joshua, PA-C  ibuprofen  (ADVIL ) 200 MG tablet Take 3 tablets (600 mg total) by mouth every 8 (eight)  hours as needed for mild pain (pain score 1-3) or moderate pain (pain score 4-6). 10/28/23   [name], PA-C  methocarbamol  (ROBAXIN ) 750 MG tablet Take 1 tablet (750 mg total) by mouth every 6 (six) hours as needed (use for muscle cramps/pain). 10/28/23   [name], PA-C  Ostomy Supplies (OSTOMY BELT LARGE) MISC 1 kit by Does not apply route daily as needed. 11/18/23 11/17/24  [name], FNP  Ostomy Supplies KIT 1 kit by Does not apply route once a week. 11/18/23 02/16/24  [name], FNP  Ostomy Supplies Pouch MISC 1 Application by Does not apply route once a week. 11/18/23 11/17/24  [name], FNP  pantoprazole  (PROTONIX ) 40 MG tablet Take 1 tablet (40 mg total) by mouth daily for 28 days. Patient not taking: Reported on 08/06/2023 12/19/22 08/06/23  [name], PA-C  Past Surgical History Past Surgical History:  Procedure Laterality Date   COLON RESECTION SIGMOID N/A 10/23/2023   Procedure: COLECTOMY, SIGMOID, OPEN;  Surgeon: Dasie Leonor CROME, MD;  Location: [facility];  Service: General;  Laterality: N/A;   COLOSTOMY N/A 10/23/2023   Procedure: CREATION, COLOSTOMY;  Surgeon: Dasie Leonor CROME, MD;  Location: [facility];  Service: General;  Laterality: N/A;   LAPAROSCOPY N/A 10/23/2023   Procedure: LAPAROSCOPY, DIAGNOSTIC;  Surgeon: Dasie Leonor CROME, MD;  Location: [facility];  Service: General;  Laterality: N/A;   Family History No family history on file.  Social History Social History   Tobacco Use   Smoking status: Light Smoker    Types: Cigarettes   Smokeless tobacco: Never  Vaping Use   Vaping status: Never Used  Substance Use Topics   Alcohol use: Yes    Alcohol/week: 21.0 standard drinks of alcohol    Types: 21 Cans of beer per week    Comment: 3 beers/day, last drink 08/05/23   Drug use: Yes    Frequency: 5.0 times per week    Types: Marijuana,  Methamphetamines, Cocaine    Comment: last use reported 08/04/23   Allergies Other  Review of Systems Review of Systems  All other systems reviewed and are negative.   Physical Exam Vital Signs  I have reviewed the triage vital signs BP 114/70 (BP Location: Right Arm)   Pulse 80   Temp 98.4 F (36.9 C) (Oral)   Resp 12   SpO2 100%  Physical Exam Vitals and nursing note reviewed.  Constitutional:      General: He is not in acute distress.    Appearance: Normal appearance.  HENT:     Mouth/Throat:     Mouth: Mucous membranes are moist.     Comments: Oropharynx clear, uvula midline, no posterior pharyngeal erythema Eyes:     Conjunctiva/sclera: Conjunctivae normal.  Cardiovascular:     Rate and Rhythm: Normal rate and regular rhythm.  Pulmonary:     Effort: Pulmonary effort is normal. No respiratory distress.     Breath sounds: Normal breath sounds. No wheezing.  Abdominal:     General: Abdomen is flat.     Palpations: Abdomen is soft.     Tenderness: There is no abdominal tenderness.  Musculoskeletal:     Right lower leg: No edema.     Left lower leg: No edema.  Skin:    General: Skin is warm and dry.     Capillary Refill: Capillary refill takes less than 2 seconds.  Neurological:     Mental Status: He is alert and oriented to person, place, and time. Mental status is at baseline.  Psychiatric:        Mood and Affect: Mood normal.        Behavior: Behavior normal.     ED Results and Treatments Labs (all labs ordered are listed, but only abnormal results are displayed) Labs Reviewed  ACETAMINOPHEN  LEVEL - Abnormal; Notable for the following components:      Result Value   Acetaminophen  (Tylenol ), Serum <10 (*)    All other components within normal limits  COMPREHENSIVE METABOLIC PANEL WITH GFR - Abnormal; Notable for the following components:   Sodium 134 (*)    Total Protein 9.0 (*)    AST 49 (*)    ALT 55 (*)    All other components within normal limits   SALICYLATE LEVEL - Abnormal; Notable for the following components:   Salicylate Lvl <7.0 (*)  All other components within normal limits  CBC WITH DIFFERENTIAL/PLATELET  ETHANOL  RAPID URINE DRUG SCREEN, HOSP PERFORMED  TROPONIN I (HIGH SENSITIVITY)                                                                                                                          Radiology DG Chest Portable 1 View Result Date: 12/17/2023 CLINICAL DATA:  Chest pain.  Skin burning after smoking meth. EXAM: PORTABLE CHEST 1 VIEW COMPARISON:  12/07/2023 FINDINGS: Elevation of the left hemidiaphragm similar to prior study. Heart size and pulmonary vascularity are normal. Lungs are clear. No pleural effusion or pneumothorax. Mediastinal contours appear intact. IMPRESSION: No active disease. Electronically Signed   By: Elsie Gravely M.D.   On: 12/17/2023 22:44    Pertinent labs & imaging results that were available during my care of the patient were reviewed by me and considered in my medical decision making (see MDM for details).  Medications Ordered in ED Medications  alum & mag hydroxide-simeth (MAALOX/MYLANTA) 200-200-20 MG/5ML suspension 30 mL (30 mLs Oral Given 12/17/23 2300)    And  lidocaine  (XYLOCAINE ) 2 % viscous mouth solution 15 mL (15 mLs Oral Given 12/17/23 2300)                                                                                                                                     Procedures Procedures  (including critical care time)  Medical Decision Making / ED Course   MDM:  43 year old presenting to the emergency department after developing sore throat, burning sensation in chest after smoking meth.  Patient overall well-appearing, physical examination with no focal abnormality.  Lungs clear without wheezing.  Posterior pharynx is clear.  Possible symptoms could be due to chemical irritation from smoking meth, could be due to underlying mild psychosis, could also  be due to underlying cardiac process given chest pain.  Will check troponin.  EKG reassuring.  Labs so far reassuring.  He is not floridly psychotic and I do not think he needs psychiatric hospitalization or IVC at this time.  He denies any thoughts of self-harm or thoughts of harming other.  Troponin negative. Will discharge patient to home. All questions answered. Patient comfortable with plan of discharge. Return precautions discussed with patient and specified on the after visit summary.       Additional history obtained: -Additional history obtained from ems -  External records from outside source obtained and reviewed including: Chart review including previous notes, labs, imaging, consultation notes including prior notes    Lab Tests: -I ordered, reviewed, and interpreted labs.   The pertinent results include:   Labs Reviewed  ACETAMINOPHEN  LEVEL - Abnormal; Notable for the following components:      Result Value   Acetaminophen  (Tylenol ), Serum <10 (*)    All other components within normal limits  COMPREHENSIVE METABOLIC PANEL WITH GFR - Abnormal; Notable for the following components:   Sodium 134 (*)    Total Protein 9.0 (*)    AST 49 (*)    ALT 55 (*)    All other components within normal limits  SALICYLATE LEVEL - Abnormal; Notable for the following components:   Salicylate Lvl <7.0 (*)    All other components within normal limits  CBC WITH DIFFERENTIAL/PLATELET  ETHANOL  RAPID URINE DRUG SCREEN, HOSP PERFORMED  TROPONIN I (HIGH SENSITIVITY)    Notable for nonspecific mild LFT abnormalities   EKG   EKG Interpretation Date/Time:  Friday December 17 2023 20:17:57 EDT Ventricular Rate:  85 PR Interval:  130 QRS Duration:  91 QT Interval:  381 QTC Calculation: 453 R Axis:   49  Text Interpretation: Sinus rhythm Consider right atrial enlargement Confirmed by Francesca Fallow ([id number]) on 12/17/2023 11:34:10 PM         Imaging Studies ordered: I ordered imaging  studies including CXR On my interpretation imaging demonstrates no acute process I independently visualized and interpreted imaging. I agree with the radiologist interpretation   Medicines ordered and prescription drug management: Meds ordered this encounter  Medications   AND Linked Order Group    alum & mag hydroxide-simeth (MAALOX/MYLANTA) 200-200-20 MG/5ML suspension 30 mL    lidocaine  (XYLOCAINE ) 2 % viscous mouth solution 15 mL    -I have reviewed the patients home medicines and have made adjustments as needed  Social Determinants of Health:  Diagnosis or treatment significantly limited by social determinants of health: polysubstance abuse   Reevaluation: After the interventions noted above, I reevaluated the patient and found that their symptoms have improved  Co morbidities that complicate the patient evaluation No past medical history on file.    Dispostion: Disposition decision including need for hospitalization was considered, and patient discharged    Final Clinical Impression(s) / ED Diagnoses Final diagnoses:  Polysubstance abuse (HCC)  Chest pain, unspecified type     This chart was dictated using voice recognition software.  Despite best efforts to proofread,  errors can occur which can change the documentation meaning.    Francesca Fallow CROME, MD 12/17/23 2339    Francesca Fallow CROME, MD 12/17/23 [phone]

## 2023-12-17 NOTE — ED Triage Notes (Addendum)
 BIBA from the street, has c/o skin burning after smoking meth. Pt talking about gasoline being on his clothes and fabric softener, and having pain all on his skin and in his neck 124/72 bp 78 hr 99% r/a 131 cbg

## 2023-12-17 NOTE — ED Provider Triage Note (Signed)
 Emergency Medicine Provider Triage Evaluation Note  Robert Duffy , a 43 y.o. male  was evaluated in triage.  Pt complains of his skin burning.  Endorses some use as well as alcohol consumption..  Review of Systems  Positive: As above Negative: As above  Physical Exam  There were no vitals taken for this visit. Gen:   Awake, no distress   Resp:  Normal effort  MSK:   Moves extremities without difficulty  Other:    Medical Decision Making  Medically screening exam initiated at 8:06 PM.  Appropriate orders placed.  Robert Duffy was informed that the remainder of the evaluation will be completed by another provider, this initial triage assessment does not replace that evaluation, and the importance of remaining in the ED until their evaluation is complete.    Robert Loge, PA-C 12/17/23 2007

## 2023-12-17 NOTE — Discharge Instructions (Addendum)
 We evaluated you for your chest pain and sore throat. Your testing in the ER was reassuring. Please follow up with your primary care physician. We suspect this is likely related to your methamphetamine use. Please try to stop using methamphetamine. Please return if you have any new or worsening symptoms.

## 2023-12-22 ENCOUNTER — Ambulatory Visit

## 2023-12-22 ENCOUNTER — Telehealth: Payer: Self-pay | Admitting: General Practice

## 2023-12-22 NOTE — Telephone Encounter (Signed)
 Called pt to reschedule missed NP appt at [facility]; could not reach or leave vm

## 2023-12-24 ENCOUNTER — Other Ambulatory Visit: Payer: Self-pay

## 2023-12-24 ENCOUNTER — Inpatient Hospital Stay (HOSPITAL_COMMUNITY)
Admission: EM | Admit: 2023-12-24 | Discharge: 2023-12-29 | DRG: 330 | Disposition: A | Discharge status: Home / Self Care | Attending: General Surgery | Admitting: General Surgery

## 2023-12-24 ENCOUNTER — Encounter (HOSPITAL_COMMUNITY): Admission: EM | Disposition: A | Discharge status: Home / Self Care | Payer: Self-pay | Source: Home / Self Care

## 2023-12-24 ENCOUNTER — Emergency Department (HOSPITAL_COMMUNITY): Admitting: Certified Registered Nurse Anesthetist

## 2023-12-24 ENCOUNTER — Emergency Department (HOSPITAL_COMMUNITY)

## 2023-12-24 ENCOUNTER — Encounter (HOSPITAL_COMMUNITY): Payer: Self-pay | Admitting: Emergency Medicine

## 2023-12-24 DIAGNOSIS — K219 Gastro-esophageal reflux disease without esophagitis: Secondary | ICD-10-CM | POA: Diagnosis present

## 2023-12-24 DIAGNOSIS — Z9049 Acquired absence of other specified parts of digestive tract: Secondary | ICD-10-CM

## 2023-12-24 DIAGNOSIS — Z933 Colostomy status: Secondary | ICD-10-CM | POA: Diagnosis not present

## 2023-12-24 DIAGNOSIS — Z638 Other specified problems related to primary support group: Secondary | ICD-10-CM | POA: Diagnosis not present

## 2023-12-24 DIAGNOSIS — Z5900 Homelessness unspecified: Secondary | ICD-10-CM

## 2023-12-24 DIAGNOSIS — F17210 Nicotine dependence, cigarettes, uncomplicated: Secondary | ICD-10-CM | POA: Diagnosis present

## 2023-12-24 DIAGNOSIS — K562 Volvulus: Secondary | ICD-10-CM

## 2023-12-24 DIAGNOSIS — R1030 Lower abdominal pain, unspecified: Principal | ICD-10-CM

## 2023-12-24 DIAGNOSIS — F1010 Alcohol abuse, uncomplicated: Secondary | ICD-10-CM | POA: Diagnosis present

## 2023-12-24 DIAGNOSIS — F419 Anxiety disorder, unspecified: Secondary | ICD-10-CM | POA: Diagnosis not present

## 2023-12-24 HISTORY — DX: Other psychoactive substance abuse, uncomplicated: F19.10

## 2023-12-24 HISTORY — PX: LAPAROTOMY: SHX154

## 2023-12-24 LAB — COMPREHENSIVE METABOLIC PANEL WITH GFR
ALT: 54 U/L — ABNORMAL HIGH (ref 0–44)
AST: 50 U/L — ABNORMAL HIGH (ref 15–41)
Albumin: 4.5 g/dL (ref 3.5–5.0)
Alkaline Phosphatase: 80 U/L (ref 38–126)
Anion gap: 12 (ref 5–15)
BUN: 9 mg/dL (ref 6–20)
CO2: 26 mmol/L (ref 22–32)
Calcium: 9.3 mg/dL (ref 8.9–10.3)
Chloride: 100 mmol/L (ref 98–111)
Creatinine, Ser: 0.83 mg/dL (ref 0.61–1.24)
GFR, Estimated: >60 mL/min (ref >60)
Glucose, Bld: 89 mg/dL (ref 70–99)
Potassium: 3.9 mmol/L (ref 3.5–5.1)
Sodium: 138 mmol/L (ref 135–145)
Total Bilirubin: 0.4 mg/dL (ref 0.0–1.2)
Total Protein: 8.1 g/dL (ref 6.5–8.1)

## 2023-12-24 LAB — CBC
HCT: 40.8 % (ref 39.0–52.0)
Hemoglobin: 13 g/dL (ref 13.0–17.0)
MCH: 28.1 pg (ref 26.0–34.0)
MCHC: 31.9 g/dL (ref 30.0–36.0)
MCV: 88.3 fL (ref 80.0–100.0)
Platelets: 356 K/uL (ref 150–400)
RBC: 4.62 MIL/uL (ref 4.22–5.81)
RDW: 13.1 % (ref 11.5–15.5)
WBC: 5.6 K/uL (ref 4.0–10.5)
nRBC: 0 % (ref 0.0–0.2)

## 2023-12-24 LAB — LIPASE, BLOOD: Lipase: 13 U/L (ref 11–51)

## 2023-12-24 SURGERY — LAPAROTOMY, EXPLORATORY
Anesthesia: General

## 2023-12-24 MED ORDER — ADULT MULTIVITAMIN W/MINERALS CH
1.0000 | ORAL_TABLET | Freq: Every day | ORAL | Status: DC
  Administered 2023-12-25 – 2023-12-29 (×5): 1 via ORAL
  Filled 2023-12-24 (×5): qty 1

## 2023-12-24 MED ORDER — FENTANYL CITRATE PF 50 MCG/ML IJ SOSY
PREFILLED_SYRINGE | INTRAMUSCULAR | Status: AC
Start: 2023-12-24 — End: 2023-12-24
  Filled 2023-12-24: qty 1

## 2023-12-24 MED ORDER — SODIUM CHLORIDE 0.9 % IR SOLN
Status: DC | PRN
  Administered 2023-12-24: 1000 mL

## 2023-12-24 MED ORDER — METHOCARBAMOL 1000 MG/10ML IJ SOLN
500.0000 mg | Freq: Three times a day (TID) | INTRAMUSCULAR | Status: DC
  Administered 2023-12-24 – 2023-12-27 (×9): 500 mg via INTRAVENOUS
  Filled 2023-12-24 (×9): qty 10

## 2023-12-24 MED ORDER — DEXAMETHASONE SODIUM PHOSPHATE 10 MG/ML IJ SOLN
INTRAMUSCULAR | Status: DC | PRN
  Administered 2023-12-24: 10 mg via INTRAVENOUS

## 2023-12-24 MED ORDER — LIDOCAINE HCL (PF) 2 % IJ SOLN
INTRAMUSCULAR | Status: DC | PRN
Start: 2023-12-24 — End: 2023-12-24
  Administered 2023-12-24: 80 mg via INTRADERMAL

## 2023-12-24 MED ORDER — ONDANSETRON HCL 4 MG/2ML IJ SOLN: 4.0000 mg | INTRAMUSCULAR | Status: DC | PRN

## 2023-12-24 MED ORDER — KCL IN DEXTROSE-NACL 20-5-0.45 MEQ/L-%-% IV SOLN
INTRAVENOUS | Status: AC
  Filled 2023-12-24 (×2): qty 1000

## 2023-12-24 MED ORDER — HYDROMORPHONE HCL 1 MG/ML IJ SOLN
INTRAMUSCULAR | Status: AC
  Filled 2023-12-24: qty 1

## 2023-12-24 MED ORDER — ACETAMINOPHEN 10 MG/ML IV SOLN
INTRAVENOUS | Status: AC
  Filled 2023-12-24: qty 100

## 2023-12-24 MED ORDER — ONDANSETRON HCL 4 MG/2ML IJ SOLN
INTRAMUSCULAR | Status: DC | PRN
  Administered 2023-12-24: 4 mg via INTRAVENOUS

## 2023-12-24 MED ORDER — MORPHINE SULFATE (PF) 2 MG/ML IV SOLN: 1.0000 mg | INTRAVENOUS | Status: DC | PRN

## 2023-12-24 MED ORDER — THIAMINE HCL 100 MG/ML IJ SOLN: 100.0000 mg | Freq: Every day | INTRAMUSCULAR | Status: DC

## 2023-12-24 MED ORDER — THIAMINE MONONITRATE 100 MG PO TABS
100.0000 mg | ORAL_TABLET | Freq: Every day | ORAL | Status: DC
  Administered 2023-12-25 – 2023-12-29 (×5): 100 mg via ORAL
  Filled 2023-12-24 (×5): qty 1

## 2023-12-24 MED ORDER — PROPOFOL 10 MG/ML IV BOLUS
INTRAVENOUS | Status: AC
  Filled 2023-12-24: qty 20

## 2023-12-24 MED ORDER — METHOCARBAMOL 500 MG PO TABS
500.0000 mg | ORAL_TABLET | Freq: Three times a day (TID) | ORAL | Status: DC
  Filled 2023-12-24: qty 1

## 2023-12-24 MED ORDER — MIDAZOLAM HCL 2 MG/2ML IJ SOLN
INTRAMUSCULAR | Status: AC
  Filled 2023-12-24: qty 2

## 2023-12-24 MED ORDER — FOLIC ACID 1 MG PO TABS
1.0000 mg | ORAL_TABLET | Freq: Every day | ORAL | Status: DC
  Administered 2023-12-25 – 2023-12-29 (×5): 1 mg via ORAL
  Filled 2023-12-24 (×5): qty 1

## 2023-12-24 MED ORDER — FENTANYL CITRATE PF 50 MCG/ML IJ SOSY
25.0000 ug | PREFILLED_SYRINGE | INTRAMUSCULAR | Status: DC | PRN
  Administered 2023-12-24 (×3): 50 ug via INTRAVENOUS

## 2023-12-24 MED ORDER — ONDANSETRON HCL 4 MG/2ML IJ SOLN: 4.0000 mg | Freq: Once | INTRAMUSCULAR | Status: DC | PRN

## 2023-12-24 MED ORDER — SUCCINYLCHOLINE CHLORIDE 200 MG/10ML IV SOSY
PREFILLED_SYRINGE | INTRAVENOUS | Status: DC | PRN
  Administered 2023-12-24: 140 mg via INTRAVENOUS

## 2023-12-24 MED ORDER — PROPOFOL 10 MG/ML IV BOLUS
INTRAVENOUS | Status: DC | PRN
  Administered 2023-12-24: 160 mg via INTRAVENOUS

## 2023-12-24 MED ORDER — MIDAZOLAM HCL 5 MG/5ML IJ SOLN
INTRAMUSCULAR | Status: DC | PRN
  Administered 2023-12-24 (×2): 1 mg via INTRAVENOUS

## 2023-12-24 MED ORDER — HYDROMORPHONE HCL 1 MG/ML IJ SOLN
0.2500 mg | INTRAMUSCULAR | Status: DC | PRN
  Administered 2023-12-24: 0.25 mg via INTRAVENOUS

## 2023-12-24 MED ORDER — LORAZEPAM 1 MG PO TABS: 1.0000 mg | ORAL_TABLET | ORAL | Status: AC | PRN

## 2023-12-24 MED ORDER — HYDRALAZINE HCL 20 MG/ML IJ SOLN: 10.0000 mg | INTRAMUSCULAR | Status: DC | PRN

## 2023-12-24 MED ORDER — DIPHENHYDRAMINE HCL 25 MG PO CAPS: 25.0000 mg | ORAL_CAPSULE | Freq: Four times a day (QID) | ORAL | Status: DC | PRN

## 2023-12-24 MED ORDER — FAMOTIDINE IN NACL 20-0.9 MG/50ML-% IV SOLN
20.0000 mg | Freq: Once | INTRAVENOUS | Status: AC
  Administered 2023-12-24: 20 mg via INTRAVENOUS
  Filled 2023-12-24: qty 50

## 2023-12-24 MED ORDER — METOPROLOL TARTRATE 5 MG/5ML IV SOLN: 5.0000 mg | Freq: Four times a day (QID) | INTRAVENOUS | Status: DC | PRN

## 2023-12-24 MED ORDER — FENTANYL CITRATE (PF) 100 MCG/2ML IJ SOLN
INTRAMUSCULAR | Status: DC | PRN
  Administered 2023-12-24 (×2): 50 ug via INTRAVENOUS
  Administered 2023-12-24: 100 ug via INTRAVENOUS
  Administered 2023-12-24: 50 ug via INTRAVENOUS

## 2023-12-24 MED ORDER — ENOXAPARIN SODIUM 40 MG/0.4ML IJ SOSY
40.0000 mg | PREFILLED_SYRINGE | INTRAMUSCULAR | Status: DC
  Administered 2023-12-25 – 2023-12-29 (×5): 40 mg via SUBCUTANEOUS
  Filled 2023-12-24 (×5): qty 0.4

## 2023-12-24 MED ORDER — AMISULPRIDE (ANTIEMETIC) 5 MG/2ML IV SOLN: 10.0000 mg | Freq: Once | INTRAVENOUS | Status: DC | PRN

## 2023-12-24 MED ORDER — LORAZEPAM 2 MG/ML IJ SOLN: 1.0000 mg | INTRAMUSCULAR | Status: AC | PRN

## 2023-12-24 MED ORDER — MORPHINE SULFATE (PF) 2 MG/ML IV SOLN
1.0000 mg | INTRAVENOUS | Status: DC | PRN
  Administered 2023-12-25: 1 mg via INTRAVENOUS
  Filled 2023-12-24: qty 1

## 2023-12-24 MED ORDER — SUGAMMADEX SODIUM 200 MG/2ML IV SOLN: INTRAVENOUS | Status: DC | PRN

## 2023-12-24 MED ORDER — ACETAMINOPHEN 10 MG/ML IV SOLN
1000.0000 mg | Freq: Four times a day (QID) | INTRAVENOUS | Status: AC
  Administered 2023-12-24 – 2023-12-25 (×4): 1000 mg via INTRAVENOUS
  Filled 2023-12-24 (×4): qty 100

## 2023-12-24 MED ORDER — FENTANYL CITRATE PF 50 MCG/ML IJ SOSY
PREFILLED_SYRINGE | INTRAMUSCULAR | Status: AC
  Filled 2023-12-24: qty 2

## 2023-12-24 MED ORDER — PIPERACILLIN-TAZOBACTAM 3.375 G IVPB
3.3750 g | INTRAVENOUS | Status: AC
  Administered 2023-12-24: 3.375 g via INTRAVENOUS
  Filled 2023-12-24: qty 50

## 2023-12-24 MED ORDER — ACETAMINOPHEN 10 MG/ML IV SOLN
1000.0000 mg | Freq: Once | INTRAVENOUS | Status: AC
  Administered 2023-12-24: 1000 mg via INTRAVENOUS

## 2023-12-24 MED ORDER — CHLORHEXIDINE GLUCONATE 0.12 % MT SOLN
15.0000 mL | Freq: Once | OROMUCOSAL | Status: AC
  Administered 2023-12-24: 15 mL via OROMUCOSAL

## 2023-12-24 MED ORDER — ONDANSETRON HCL 4 MG/2ML IJ SOLN: INTRAMUSCULAR | Status: DC | PRN

## 2023-12-24 MED ORDER — CALCIUM CARBONATE ANTACID 500 MG PO CHEW
1.0000 | CHEWABLE_TABLET | Freq: Once | ORAL | Status: AC
  Administered 2023-12-24: 200 mg via ORAL
  Filled 2023-12-24: qty 1

## 2023-12-24 MED ORDER — IOHEXOL 300 MG/ML  SOLN
100.0000 mL | Freq: Once | INTRAMUSCULAR | Status: AC | PRN
  Administered 2023-12-24: 100 mL via INTRAVENOUS

## 2023-12-24 MED ORDER — DIPHENHYDRAMINE HCL 50 MG/ML IJ SOLN: 25.0000 mg | Freq: Four times a day (QID) | INTRAMUSCULAR | Status: DC | PRN

## 2023-12-24 MED ORDER — ROCURONIUM BROMIDE 10 MG/ML (PF) SYRINGE
PREFILLED_SYRINGE | INTRAVENOUS | Status: AC
  Filled 2023-12-24: qty 10

## 2023-12-24 MED ORDER — SUGAMMADEX SODIUM 200 MG/2ML IV SOLN
INTRAVENOUS | Status: DC | PRN
  Administered 2023-12-24: 200 mg via INTRAVENOUS

## 2023-12-24 MED ORDER — SUGAMMADEX SODIUM 200 MG/2ML IV SOLN
INTRAVENOUS | Status: AC
  Filled 2023-12-24: qty 2

## 2023-12-24 MED ORDER — LACTATED RINGERS IV SOLN: INTRAVENOUS | Status: DC | PRN

## 2023-12-24 MED ORDER — LACTATED RINGERS IV SOLN: INTRAVENOUS | Status: DC

## 2023-12-24 MED ORDER — ONDANSETRON HCL 4 MG/2ML IJ SOLN
INTRAMUSCULAR | Status: AC
  Filled 2023-12-24: qty 2

## 2023-12-24 MED ORDER — FENTANYL CITRATE (PF) 250 MCG/5ML IJ SOLN
INTRAMUSCULAR | Status: AC
  Filled 2023-12-24: qty 5

## 2023-12-24 MED ORDER — ROCURONIUM BROMIDE 10 MG/ML (PF) SYRINGE
PREFILLED_SYRINGE | INTRAVENOUS | Status: DC | PRN
  Administered 2023-12-24: 50 mg via INTRAVENOUS

## 2023-12-24 MED ORDER — POTASSIUM CHLORIDE 2 MEQ/ML IV SOLN: INTRAVENOUS | Status: DC

## 2023-12-24 MED ORDER — DEXAMETHASONE SODIUM PHOSPHATE 10 MG/ML IJ SOLN
INTRAMUSCULAR | Status: AC
  Filled 2023-12-24: qty 1

## 2023-12-24 SURGICAL SUPPLY — 35 items
APPLICATOR COTTON TIP 6 STRL (MISCELLANEOUS) ×1 IMPLANT
BAG COUNTER SPONGE SURGICOUNT (BAG) IMPLANT
BLADE EXTENDED COATED 6.5IN (ELECTRODE) IMPLANT
BLADE HEX COATED 2.75 (ELECTRODE) ×1 IMPLANT
CHLORAPREP W/TINT 26 (MISCELLANEOUS) ×1 IMPLANT
COVER MAYO STAND STRL (DRAPES) IMPLANT
COVER SURGICAL LIGHT HANDLE (MISCELLANEOUS) ×1 IMPLANT
DRAPE LAPAROSCOPIC ABDOMINAL (DRAPES) ×1 IMPLANT
DRAPE WARM FLUID 44X44 (DRAPES) IMPLANT
DRSG MEPILEX POST OP 4X8 (GAUZE/BANDAGES/DRESSINGS) IMPLANT
ELECT REM PT RETURN 15FT ADLT (MISCELLANEOUS) ×1 IMPLANT
GAUZE SPONGE 4X4 12PLY STRL (GAUZE/BANDAGES/DRESSINGS) ×1 IMPLANT
GLOVE BIO SURGEON STRL SZ7.5 (GLOVE) ×2 IMPLANT
GLOVE BIOGEL PI IND STRL 7.0 (GLOVE) ×1 IMPLANT
GOWN STRL REUS W/ TWL LRG LVL3 (GOWN DISPOSABLE) ×1 IMPLANT
GOWN STRL REUS W/ TWL XL LVL3 (GOWN DISPOSABLE) ×1 IMPLANT
HANDLE SUCTION POOLE (INSTRUMENTS) IMPLANT
KIT BASIN OR (CUSTOM PROCEDURE TRAY) ×1 IMPLANT
KIT TURNOVER KIT A (KITS) ×1 IMPLANT
LIGASURE IMPACT 36 18CM CVD LR (INSTRUMENTS) IMPLANT
NS IRRIG 1000ML POUR BTL (IV SOLUTION) ×1 IMPLANT
PACK GENERAL/GYN (CUSTOM PROCEDURE TRAY) ×1 IMPLANT
RELOAD STAPLE 75 3.8 BLU REG (ENDOMECHANICALS) IMPLANT
SPONGE T-LAP 18X18 ~~LOC~~+RFID (SPONGE) IMPLANT
STAPLER PROXIMATE 75MM BLUE (STAPLE) ×1 IMPLANT
STAPLER SKIN PROX 35W (STAPLE) ×1 IMPLANT
SUT PDS AB 1 TP1 96 (SUTURE) IMPLANT
SUT SILK 2 0 SH CR/8 (SUTURE) IMPLANT
SUT SILK 2-0 18XBRD TIE 12 (SUTURE) IMPLANT
SUT SILK 3 0 SH CR/8 (SUTURE) IMPLANT
SUT SILK 3-0 18XBRD TIE 12 (SUTURE) IMPLANT
TOWEL OR 17X26 10 PK STRL BLUE (TOWEL DISPOSABLE) ×2 IMPLANT
TRAY FOLEY MTR SLVR 16FR STAT (SET/KITS/TRAYS/PACK) IMPLANT
WATER STERILE IRR 1000ML POUR (IV SOLUTION) ×1 IMPLANT
YANKAUER SUCT BULB TIP NO VENT (SUCTIONS) IMPLANT

## 2023-12-24 NOTE — Anesthesia Procedure Notes (Signed)
 Procedure Name: Intubation Date/Time: 12/24/2023 9:38 AM  Performed by: Zulema Leita PARAS, CRNAPre-anesthesia Checklist: Patient identified, Emergency Drugs available, Suction available and Patient being monitored Patient Re-evaluated:Patient Re-evaluated prior to induction Oxygen Delivery Method: Circle system utilized Preoxygenation: Pre-oxygenation with 100% oxygen Induction Type: IV induction and Rapid sequence Laryngoscope Size: Mac and 4 Grade View: Grade I Tube type: Oral Tube size: 7.5 mm Number of attempts: 1 Airway Equipment and Method: Stylet Placement Confirmation: ETT inserted through vocal cords under direct vision, positive ETCO2 and breath sounds checked- equal and bilateral Secured at: 22 cm Tube secured with: Tape Dental Injury: Teeth and Oropharynx as per pre-operative assessment

## 2023-12-24 NOTE — ED Notes (Signed)
 Patient transported to CT

## 2023-12-24 NOTE — ED Triage Notes (Signed)
 Pt to ED via [facility], pt homeless, c/o lower abd pain for a couple hours.  Denies n/v/d, states thinks ate too much after having lucky charms, cherry pie, and pork.  EMS vitals 138/82 BP, 65 HR, 16 RR, 98% RA.

## 2023-12-24 NOTE — Progress Notes (Signed)
 ICM consulted for substance abuse resources. Resources provided and attached to AVS. No additional ICM needs.

## 2023-12-24 NOTE — ED Provider Notes (Signed)
 [place] EMERGENCY DEPARTMENT AT [facility] Provider Note   CSN: [id number] Arrival date & time: 12/24/23  9651     Patient presents with: Abdominal Pain   Robert Duffy is a 43 y.o. male with medical history to include opiate overdose, psychoactive substance induced psychosis, large bowel obstruction, sigmoid volvulus, colostomy on 10/23/2023.  Patient presents to ED for evaluation of abdominal pain.  Reports that he has had 1 hour of abdominal pain located in his lower abdomen.  He reports that his ostomy is still having good output.  He denies any fevers, nausea or vomiting or diarrhea at home.  He denies chest pain or shortness of breath.  States that his symptoms began after eating pork, like a charm tonight which he attributes to his symptoms.  He has not follow-up with GI yet.     Abdominal Pain      Prior to Admission medications   Medication Sig Start Date End Date Taking? Authorizing Provider  acetaminophen  (TYLENOL ) 500 MG tablet Take 2 tablets (1,000 mg total) by mouth every 6 (six) hours as needed. 10/28/23   [name], PA-C  docusate sodium  (COLACE) 100 MG capsule Take 1 capsule (100 mg total) by mouth 2 (two) times daily. 10/28/23   [name], PA-C  famotidine  (PEPCID ) 20 MG tablet Take 1 tablet (20 mg total) by mouth 2 (two) times daily. Patient not taking: Reported on 08/06/2023 07/18/20   Geiple, Joshua, PA-C  ibuprofen  (ADVIL ) 200 MG tablet Take 3 tablets (600 mg total) by mouth every 8 (eight) hours as needed for mild pain (pain score 1-3) or moderate pain (pain score 4-6). 10/28/23   [name], PA-C  methocarbamol  (ROBAXIN ) 750 MG tablet Take 1 tablet (750 mg total) by mouth every 6 (six) hours as needed (use for muscle cramps/pain). 10/28/23   [name], PA-C  Ostomy Supplies (OSTOMY BELT LARGE) MISC 1 kit by Does not apply route daily as needed. 11/18/23 11/17/24  [name], FNP  Ostomy Supplies KIT 1 kit by Does not apply route once a  week. 11/18/23 02/16/24  [name], FNP  Ostomy Supplies Pouch MISC 1 Application by Does not apply route once a week. 11/18/23 11/17/24  [name], FNP  pantoprazole  (PROTONIX ) 40 MG tablet Take 1 tablet (40 mg total) by mouth daily for 28 days. Patient not taking: Reported on 08/06/2023 12/19/22 08/06/23  [name], PA-C    Allergies: Other    Review of Systems  Gastrointestinal:  Positive for abdominal pain.  All other systems reviewed and are negative.   Updated Vital Signs BP (!) 126/96 (BP Location: Left Arm)   Pulse 67   Temp (!) 97.5 F (36.4 C) (Oral)   Resp 18   Ht 5' 11 (1.803 m)   Wt 75.3 kg   SpO2 100%   BMI 23.15 kg/m   Physical Exam Vitals and nursing note reviewed.  Constitutional:      General: He is not in acute distress.    Appearance: He is well-developed.  HENT:     Head: Normocephalic and atraumatic.  Eyes:     Conjunctiva/sclera: Conjunctivae normal.  Cardiovascular:     Rate and Rhythm: Normal rate and regular rhythm.     Heart sounds: No murmur heard. Pulmonary:     Effort: Pulmonary effort is normal. No respiratory distress.     Breath sounds: Normal breath sounds.  Abdominal:     Palpations: Abdomen is soft.     Tenderness: There is  no abdominal tenderness.     Comments: No distention to abdomen.  Ostomy left lower quadrant.  Musculoskeletal:        General: No swelling.     Cervical back: Neck supple.  Skin:    General: Skin is warm and dry.     Capillary Refill: Capillary refill takes less than 2 seconds.  Neurological:     Mental Status: He is alert.  Psychiatric:        Mood and Affect: Mood normal.     (all labs ordered are listed, but only abnormal results are displayed) Labs Reviewed  COMPREHENSIVE METABOLIC PANEL WITH GFR - Abnormal; Notable for the following components:      Result Value   AST 50 (*)    ALT 54 (*)    All other components within normal limits  LIPASE, BLOOD  CBC  URINALYSIS, ROUTINE W  REFLEX MICROSCOPIC    EKG: None  Radiology: CT ABDOMEN PELVIS W CONTRAST Result Date: 12/24/2023 EXAM: CT ABDOMEN AND PELVIS WITH CONTRAST 12/24/2023 05:18:51 AM TECHNIQUE: CT of the abdomen and pelvis was performed with the administration of intravenous contrast. Multiplanar reformatted images are provided for review. Automated exposure control, iterative reconstruction, and/or weight-based adjustment of the mA/kV was utilized to reduce the radiation dose to as low as reasonably achievable. COMPARISON: 10/23/2023 CLINICAL HISTORY: Abdominal pain, acute, nonlocalized; lower abdominal pain in setting of colostomy recently placed due to volvulus. Lower abd pain for a couple hours, s/p sigmoid colon resection with colostomy 10/23/23, wbc's 5.6, GFR>60, prev ct a/p 10/23/23. FINDINGS: LOWER CHEST: Asymmetric elevation of the left hemidiaphragm. LIVER: Normal size and contour. GALLBLADDER AND BILE DUCTS: No wall thickening. No cholelithiasis. No biliary ductal dilatation. SPLEEN: Normal size. No focal lesion. PANCREAS: No mass. No ductal dilatation. ADRENAL GLANDS: Normal appearance. No mass. KIDNEYS, URETERS AND BLADDER: Nonobstructing stone within upper pole of right kidney measures 2.4 mm. No hydronephrosis. No perinephric or periureteral stranding. Urinary bladder is unremarkable. GI AND BOWEL: Moderate distention of the gastric lumen which appears similar to the previous exam with air-fluid level noted. Multiple abnormally dilated loops of small bowel with air-fluid levels within the abdomen concerning for small bowel obstruction. There is an abrupt transition point to decrease caliber small bowel along within the left lower quadrant of the abdomen coronal image 59/7 and axial image 57/2. Within the adjacent mesenteric vascular pedicle there is counterclockwise twisting of the vessels, image 54/2. Persistent moderate distention in the ascending colon as well as the transverse colon with moderate retained stool.  Since the previous exam there has been interval left colostomy.The colon proximal to the ostomy appears nondilated. PERITONEUM AND RETROPERITONEUM: No free fluid or focal fluid collections. No signs of pneumoperitoneum. VASCULATURE: Patent abdominal vascularity with Counterclockwise twisting of the mesenteric vascular pedicle LYMPH NODES: No lymphadenopathy. REPRODUCTIVE ORGANS: Prostate gland appears enlarged. BONES AND SOFT TISSUES: No acute osseous abnormality. No focal soft tissue abnormality. IMPRESSION: 1. Small bowel obstruction with an abrupt transition point in the left lower quadrant and associated counterclockwise twisting of the adjacent mesenteric vascular pedicle, concerning for volvulus. 2. Moderate distention of the gastric lumen with air-fluid level, similar to the previous exam. 3. Status post left colostomy. Persistent moderate distention of the ascending and transverse colon with moderate retained stool. The colon proximal to the ostomy appears nondilated. Electronically signed by: Waddell Calk MD 12/24/2023 06:05 AM EDT RP Workstation: [id number]    Procedures   Medications Ordered in the ED  famotidine  (PEPCID ) IVPB 20  mg premix (20 mg Intravenous New Bag/Given 12/24/23 0523)  calcium  carbonate (TUMS - dosed in mg elemental calcium ) chewable tablet 200 mg of elemental calcium  (200 mg of elemental calcium  Oral Given 12/24/23 0522)  iohexol  (OMNIPAQUE ) 300 MG/ML solution 100 mL (100 mLs Intravenous Contrast Given 12/24/23 0506)    Clinical Course as of 12/24/23 9361  Fri Dec 24, 2023  0611 urgent sigmoid colectomy with end colostomy on 6/28 for sigmoid volvulus [CG]  9375 Small bowel obstruction with an abrupt transition point in the left lower quadrant and associated counterclockwise twisting of the adjacent mesenteric vascular pedicle, concerning for volvulus.   [CG]  [id number] Dr. Dasie, on call for surgery, she did his initial colectomy -- lower abdominal pain, decreased BM, gas  -- needs to be seen by GI prior to reversal of collectomy. New SBO noted.  [CP]    Clinical Course User Index [CG] Robert Lonni FALCON, PA-C [CP] Rosan Sherlean DEL, PA-C   Medical Decision Making Amount and/or Complexity of Data Reviewed Labs: ordered. Radiology: ordered.  Risk OTC drugs. Prescription drug management.   This is a 43 year old male with a history of sigmoid volvulus with sigmoid colectomy and end colostomy on 6/28.  Patient presents to ED complaining of lower abdominal pain beginning 1 hour prior to arrival.  On exam, he has no distention of his abdomen.  He has an ostomy in the left lower quadrant.  He is afebrile and nontachycardic.  His lung sounds are clear bilaterally, he is not hypoxic.  Abdomen has tenderness in lower quadrants.  Neuroexam at baseline.  Will assess with CBC, CMP, lipase, CT abdomen pelvis.  Patient given Protonix , Tums.  CBC without leukocytosis or anemia.  Metabolic panel with AST, ALT elevation however this is baseline.  Lipase 13.  CT abdomen pelvis reveals a small bowel obstruction with an abrupt transition point the left lower quadrant and associated counterclockwise twisting of the adjacent mesenteric vascular pedicle concerning for volvulus.  Patient has had no nausea or vomiting here in the department.  Abdomen nondistended.  Placed consult with general surgery physician Dr. Dasie.  At end of shift, general surgery consult pending.  Signed out to oncoming provider Prosperi PA-C.    Final diagnoses:  Lower abdominal pain  Volvulus [facility])    ED Discharge Orders     None          Robert Duffy 12/24/23 9361    Robert Lenis, MD 12/24/23 [phone]

## 2023-12-24 NOTE — Anesthesia Postprocedure Evaluation (Signed)
 Anesthesia Post Note  Patient: Rosalyn Glatter  Procedure(s) Performed: EXPLORATORY LAPAROTOMY WITH REDUCTION OF SMALL BOWEL VOLVULUS     Patient location during evaluation: PACU Anesthesia Type: General Level of consciousness: awake and alert Pain management: pain level controlled Vital Signs Assessment: post-procedure vital signs reviewed and stable Respiratory status: spontaneous breathing, nonlabored ventilation and respiratory function stable Cardiovascular status: blood pressure returned to baseline and stable Postop Assessment: no apparent nausea or vomiting Anesthetic complications: no   No notable events documented.  Last Vitals:  Vitals:   12/24/23 1230 12/24/23 1256  BP: (!) 149/100 (!) 154/98  Pulse: (!) 50 72  Resp: 12 18  Temp:  36.5 C  SpO2: 97% 100%    Last Pain:  Vitals:   12/24/23 1256  TempSrc: Oral  PainSc: 8                  Garnette FORBES Skillern

## 2023-12-24 NOTE — Interval H&P Note (Signed)
 History and Physical Interval Note:  12/24/2023 8:48 AM  Robert Duffy  has presented today for surgery, with the diagnosis of Small bowel volvulus.  The various methods of treatment have been discussed with the patient and family. After consideration of risks, benefits and other options for treatment, the patient has consented to  Procedure(s) with comments: LAPAROTOMY, EXPLORATORY (N/A) - Possible bowel resection. as a surgical intervention.  The patient's history has been reviewed, patient examined, no change in status, stable for surgery.  I have reviewed the patient's chart and labs.  Questions were answered to the patient's satisfaction.     Deward Null III

## 2023-12-24 NOTE — Op Note (Signed)
 12/24/2023  10:21 AM  PATIENT:  Robert Duffy  43 y.o. male  PRE-OPERATIVE DIAGNOSIS:  Small bowel volvulus  POST-OPERATIVE DIAGNOSIS:  Small bowel volvulus  PROCEDURE:  Procedure(s) with comments: EXPLORATORY LAPAROTOMY WITH REDUCTION OF SMALL BOWEL VOLVULUS   SURGEON:  Surgeons and Role:    * Curvin Deward MOULD, MD - Primary    * Dasie Leonor CROME, MD - Assisting  PHYSICIAN ASSISTANT:   ASSISTANTS: Dr. Dasie   ANESTHESIA:   general  EBL:  10 mL   BLOOD ADMINISTERED:none  DRAINS: none   LOCAL MEDICATIONS USED:  NONE  SPECIMEN:  No Specimen  DISPOSITION OF SPECIMEN:  N/A  COUNTS:  YES  TOURNIQUET:  * No tourniquets in log *  DICTATION: .Dragon Dictation  After informed consent was obtained the patient was brought to the operating room and placed in the supine position on the operating table.  After adequate induction of general anesthesia the ostomy was closed with a 2-0 silk pursestring stitch.  The abdomen was then prepped with Betadine and draped in usual sterile manner.  An appropriate timeout was performed.  A midline incision was made with a 10 blade knife.  The incision was carried through the skin and subcutaneous tissue sharply with the electrocautery until the linea alba is identified.  The linea alba was incised with the electrocautery.  The preperitoneal space was probed bluntly with a hemostat until the peritoneum was opened and access was gained to the abdominal cavity.  The rest of the incision was then opened under direct vision.  There were no adhesions to the anterior abdominal wall.  I then began eviscerating the small bowel.  It appears that the distal small bowel had wrapped itself superiorly around the ostomy that it was creating a relative obstruction.  The small bowel was untwisted from around the ostomy.  There was no space to close to prevent this from happening again.  The small bowel all appeared to be normal except for the dilation of the distal small  bowel.  The rest of the abdominal cavity was normal with no significant abnormalities.  At this point I replaced the small bowel back into the abdominal cavity taking care to put the distal small bowel below where the ostomy was coming out.  The NG tube was confirmed in the stomach.  The fascia of the anterior abdominal wall was then closed with 2 running #1 double-stranded looped PDS sutures.  The skin was closed with staples.  Sterile dressings were applied.  The patient tolerated the procedure well.  At the end of the case all needle sponge and instrument counts were correct.  The patient was then awakened and taken recovery in stable condition.  The assistant was instrumental for completion of the case.  PLAN OF CARE: Admit to inpatient   PATIENT DISPOSITION:  PACU - hemodynamically stable.   Delay start of Pharmacological VTE agent (>24hrs) due to surgical blood loss or risk of bleeding: no

## 2023-12-24 NOTE — Transfer of Care (Signed)
 Immediate Anesthesia Transfer of Care Note  Patient: Robert Duffy  Procedure(s) Performed: EXPLORATORY LAPAROTOMY WITH REDUCTION OF SMALL BOWEL VOLVULUS  Patient Location: PACU  Anesthesia Type:General  Level of Consciousness: drowsy  Airway & Oxygen Therapy: Patient Spontanous Breathing and Patient connected to face mask oxygen  Post-op Assessment: Report given to RN and Post -op Vital signs reviewed and stable  Post vital signs: Reviewed and stable  Last Vitals:  Vitals Value Taken Time  BP 152/91 12/24/23 10:37  Temp    Pulse 70 12/24/23 10:37  Resp 12 12/24/23 10:38  SpO2 100 % 12/24/23 10:37  Vitals shown include unfiled device data.  Last Pain:  Vitals:   12/24/23 0911  TempSrc:   PainSc: 8          Complications: No notable events documented.

## 2023-12-24 NOTE — ED Provider Notes (Signed)
 Accepted handoff at shift change from [facility]. Please see prior provider note for more detail.   Briefly: Patient is 43 y.o.   DDX: concern for Dr. Dasie, on call for surgery, she did his initial colectomy -- lower abdominal pain, decreased BM, gas -- needs to be seen by GI prior to reversal of collectomy. New SBO noted.   Plan: New volvulus on CT, I spoke with Burnard with general surgery who will plan to admit this patient for surgical management.     Rosan Sherlean DEL, PA-C 12/24/23 9281    Freddi Hamilton, MD 12/28/23 [phone]

## 2023-12-24 NOTE — Plan of Care (Signed)
 ?  Problem: Clinical Measurements: ?Goal: Ability to maintain clinical measurements within normal limits will improve ?Outcome: Progressing ?Goal: Will remain free from infection ?Outcome: Progressing ?Goal: Diagnostic test results will improve ?Outcome: Progressing ?  ?

## 2023-12-24 NOTE — Anesthesia Preprocedure Evaluation (Addendum)
 Anesthesia Evaluation  Patient identified by MRN, date of birth, ID band Patient awake    Reviewed: Allergy & Precautions, NPO status , Patient's Chart, lab work & pertinent test results  Airway Mallampati: II  TM Distance: >3 FB Neck ROM: Full    Dental  (+) Dental Advisory Given, Missing   Pulmonary Current Smoker and Patient abstained from smoking.   Pulmonary exam normal breath sounds clear to auscultation       Cardiovascular negative cardio ROS Normal cardiovascular exam Rhythm:Regular Rate:Normal     Neuro/Psych  PSYCHIATRIC DISORDERS Anxiety     negative neurological ROS     GI/Hepatic ,GERD  Medicated,,(+)     substance abuse (Last meth 8/28)  cocaine use, marijuana use and methamphetamine useSmall bowel volvulus S/p colectomy    Endo/Other  negative endocrine ROS    Renal/GU negative Renal ROS     Musculoskeletal negative musculoskeletal ROS (+)  narcotic dependent  Abdominal   Peds  Hematology negative hematology ROS (+)   Anesthesia Other Findings Day of surgery medications reviewed with the patient.  Reproductive/Obstetrics                              Anesthesia Physical Anesthesia Plan  ASA: 3 and emergent  Anesthesia Plan: General   Post-op Pain Management: Ofirmev  IV (intra-op)* and Toradol  IV (intra-op)*   Induction: Intravenous, Rapid sequence and Cricoid pressure planned  PONV Risk Score and Plan: 2 and Dexamethasone  and Ondansetron   Airway Management Planned: Oral ETT  Additional Equipment: Arterial line  Intra-op Plan:   Post-operative Plan: Possible Post-op intubation/ventilation  Informed Consent: I have reviewed the patients History and Physical, chart, labs and discussed the procedure including the risks, benefits and alternatives for the proposed anesthesia with the patient or authorized representative who has indicated his/her understanding and  acceptance.     Dental advisory given and Only emergency history available  Plan Discussed with: CRNA  Anesthesia Plan Comments:          Anesthesia Quick Evaluation

## 2023-12-24 NOTE — H&P (Signed)
 Rosalyn Glatter Oct 24, 1980  [id number].    Chief Complaint/Reason for Consult: small bowel volvulus  HPI:  This is a 43 yo black male with a history of polysubstance abuse (meth, cocaine, at least 4 16 oz beers/day, and tobacco abuse 1 pack per week) who recently underwent a laparotomy for sigmoid colectomy/colostomy due to a sigmoid volvulus.  He has been doing well and just saw Dr. Dasie in the office 3 days ago with no issues.  He states he woke up early this morning with some abdominal pain.  He admits to some dry heaving, but no vomiting.  He is not a great historian so difficult to get much more pertinent information.  His colostomy seems to have slowed overnight.  He is unsure if he has bloating.  He admits to chest pain, but states this has been present for 3 years since an accident.  He is also unaware of when the rash/redness on his chest wall started.  He lives in an acquaintance's building.  He is otherwise homeless.  He is estranged from his siblings and his parents are deceased.    He presented to the ED due to pain in his abdomen.  He has normal labs, but a CT scan concerning for SB volvulus with significant proximal SB and gastric distention.  We have been asked to see him for further recommendations.   ROS: ROS: see HPI  History reviewed. No pertinent family history.  Past Medical History:  Diagnosis Date   Polysubstance abuse [facility])     Past Surgical History:  Procedure Laterality Date   COLON RESECTION SIGMOID N/A 10/23/2023   Procedure: COLECTOMY, SIGMOID, OPEN;  Surgeon: Dasie Leonor CROME, MD;  Location: [facility];  Service: General;  Laterality: N/A;   COLOSTOMY N/A 10/23/2023   Procedure: CREATION, COLOSTOMY;  Surgeon: Dasie Leonor CROME, MD;  Location: [facility];  Service: General;  Laterality: N/A;   LAPAROSCOPY N/A 10/23/2023   Procedure: LAPAROSCOPY, DIAGNOSTIC;  Surgeon: Dasie Leonor CROME, MD;  Location: [facility];  Service: General;  Laterality: N/A;    Social  History:  reports that he has been smoking cigarettes. He has never used smokeless tobacco. He reports current alcohol use of about 28.0 standard drinks of alcohol per week. He reports current drug use. Frequency: 5.00 times per week. Drugs: Marijuana, Methamphetamines, and Cocaine.  Allergies:  Allergies  Allergen Reactions   Other Shortness Of Breath, Itching and Other (See Comments)    Cannot tolerate strong smells- Car exhaust, Clorox, etc... Itchy eyes, other allergic symptoms, etc..    (Not in a hospital admission)    Physical Exam: Blood pressure (!) 130/96, pulse 71, temperature 98.5 F (36.9 C), temperature source Oral, resp. rate 16, height 5' 11 (1.803 m), weight 75.3 kg, SpO2 99%. General: WD black male who is laying in bed in NAD HEENT: head is normocephalic, atraumatic.  Sclera are noninjected.  PERRL.  Ears and nose without any masses or lesions.  Mouth is pink and dry Heart: regular, rate, and rhythm.  Normal s1,s2. No obvious murmurs, gallops, or rubs noted.  Lungs: CTAB, no wheezes, rhonchi, or rales noted.  Respiratory effort nonlabored Abd: soft, minimally tender around umbilicus, distended, +BS, no masses, hernias, or organomegaly.  Midline scar noted.  LLQ colostomy present with some stool in the bag (unclear duration) and stoma is pink and viable MS: all 4 extremities are symmetrical with no cyanosis, clubbing, or edema. Psych: A&Ox3 with a somewhat delayed response and at times  have to rephrase questions to get an answer.   Results for orders placed or performed during the hospital encounter of 12/24/23 (from the past 48 hours)  Lipase, blood     Status: None   Collection Time: 12/24/23  4:07 AM  Result Value Ref Range   Lipase 13 11 - 51 U/L    Comment: Performed at [facility], [address] [place], [place] [address]  Comprehensive metabolic panel     Status: Abnormal   Collection Time: 12/24/23  4:07 AM  Result Value Ref Range    Sodium 138 135 - 145 mmol/L   Potassium 3.9 3.5 - 5.1 mmol/L   Chloride 100 98 - 111 mmol/L   CO2 26 22 - 32 mmol/L   Glucose, Bld 89 70 - 99 mg/dL    Comment: Glucose reference range applies only to samples taken after fasting for at least 8 hours.   BUN 9 6 - 20 mg/dL   Creatinine, Ser 9.16 0.61 - 1.24 mg/dL   Calcium  9.3 8.9 - 10.3 mg/dL   Total Protein 8.1 6.5 - 8.1 g/dL   Albumin 4.5 3.5 - 5.0 g/dL   AST 50 (H) 15 - 41 U/L   ALT 54 (H) 0 - 44 U/L   Alkaline Phosphatase 80 38 - 126 U/L   Total Bilirubin 0.4 0.0 - 1.2 mg/dL   GFR, Estimated >39 >39 mL/min    Comment: (NOTE) Calculated using the CKD-EPI Creatinine Equation (2021)    Anion gap 12 5 - 15    Comment: Performed at [facility], [address] [place], [place] [address]  CBC     Status: None   Collection Time: 12/24/23  4:07 AM  Result Value Ref Range   WBC 5.6 4.0 - 10.5 K/uL   RBC 4.62 4.22 - 5.81 MIL/uL   Hemoglobin 13.0 13.0 - 17.0 g/dL   HCT 59.1 60.9 - 47.9 %   MCV 88.3 80.0 - 100.0 fL   MCH 28.1 26.0 - 34.0 pg   MCHC 31.9 30.0 - 36.0 g/dL   RDW 86.8 88.4 - 84.4 %   Platelets 356 150 - 400 K/uL   nRBC 0.0 0.0 - 0.2 %    Comment: Performed at [facility], [address] [place], [place] [address]   CT ABDOMEN PELVIS W CONTRAST Result Date: 12/24/2023 EXAM: CT ABDOMEN AND PELVIS WITH CONTRAST 12/24/2023 05:18:51 AM TECHNIQUE: CT of the abdomen and pelvis was performed with the administration of intravenous contrast. Multiplanar reformatted images are provided for review. Automated exposure control, iterative reconstruction, and/or weight-based adjustment of the mA/kV was utilized to reduce the radiation dose to as low as reasonably achievable. COMPARISON: 10/23/2023 CLINICAL HISTORY: Abdominal pain, acute, nonlocalized; lower abdominal pain in setting of colostomy recently placed due to volvulus. Lower abd pain for a couple hours, s/p sigmoid colon resection with colostomy  10/23/23, wbc's 5.6, GFR>60, prev ct a/p 10/23/23. FINDINGS: LOWER CHEST: Asymmetric elevation of the left hemidiaphragm. LIVER: Normal size and contour. GALLBLADDER AND BILE DUCTS: No wall thickening. No cholelithiasis. No biliary ductal dilatation. SPLEEN: Normal size. No focal lesion. PANCREAS: No mass. No ductal dilatation. ADRENAL GLANDS: Normal appearance. No mass. KIDNEYS, URETERS AND BLADDER: Nonobstructing stone within upper pole of right kidney measures 2.4 mm. No hydronephrosis. No perinephric or periureteral stranding. Urinary bladder is unremarkable. GI AND BOWEL: Moderate distention of the gastric lumen which appears similar to the previous exam with air-fluid level noted. Multiple abnormally  dilated loops of small bowel with air-fluid levels within the abdomen concerning for small bowel obstruction. There is an abrupt transition point to decrease caliber small bowel along within the left lower quadrant of the abdomen coronal image 59/7 and axial image 57/2. Within the adjacent mesenteric vascular pedicle there is counterclockwise twisting of the vessels, image 54/2. Persistent moderate distention in the ascending colon as well as the transverse colon with moderate retained stool. Since the previous exam there has been interval left colostomy.The colon proximal to the ostomy appears nondilated. PERITONEUM AND RETROPERITONEUM: No free fluid or focal fluid collections. No signs of pneumoperitoneum. VASCULATURE: Patent abdominal vascularity with Counterclockwise twisting of the mesenteric vascular pedicle LYMPH NODES: No lymphadenopathy. REPRODUCTIVE ORGANS: Prostate gland appears enlarged. BONES AND SOFT TISSUES: No acute osseous abnormality. No focal soft tissue abnormality. IMPRESSION: 1. Small bowel obstruction with an abrupt transition point in the left lower quadrant and associated counterclockwise twisting of the adjacent mesenteric vascular pedicle, concerning for volvulus. 2. Moderate distention of  the gastric lumen with air-fluid level, similar to the previous exam. 3. Status post left colostomy. Persistent moderate distention of the ascending and transverse colon with moderate retained stool. The colon proximal to the ostomy appears nondilated. Electronically signed by: Waddell Calk MD 12/24/2023 06:05 AM EDT RP Workstation: [id number]      Assessment/Plan Small bowel volvulus The patient has been seen, examined, labs, vitals, chart, and imaging personally.  He has evidence of a small bowel volvulus.  He is 2 months s/p sigmoid colectomy/colostomy for a sigmoid volvulus.  We discussed the need to return to the OR for correction of this problem.  We discussed the procedure, but also the risks of complications due to proximity of another surgery, but also risk of bowel ischemia from twisting and possible need for resection.  We discussed extensive LOA, fistulas, etc that can occur as a risk from recent surgery.  We discussed the possibility of an open abdomen given what his bowel looks like during surgery.  We also had a discussion regarding his polysubstance abuse and the risks that can have with anesthesia such as MI/death.  We will place an NGT pre-operatively given the size of his stomach to minimize his risk of aspiration during induction.  EKG has also been ordered given his  chest pain at times and his drug use.  We will plan to admit post op for post op management.  He states his parents are deceased and he is estranged from all of his siblings.  He doesn't have a next of kin that would help make decisions for him if needed as he does not have any of his siblings contact information, etc.     FEN - NPO/NGT/IVFs VTE - lovenox  to follow, SCDs in OR ID - zosyn  on call Admit - inpatient, OR, then med-surg if stable  Polysubstance abuse ETOH abuse - CIWA  I reviewed ED provider notes, last 24 h vitals and pain scores, last 48 h intake and output, last 24 h labs and trends, last 24 h  imaging results, and Care everywhere.  Burnard FORBES Banter, [facility] Surgery 12/24/2023, 8:18 AM Please see Amion for pager number during day hours 7:00am-4:30pm or 7:00am -11:30am on weekends

## 2023-12-25 ENCOUNTER — Encounter (HOSPITAL_COMMUNITY): Payer: Self-pay | Admitting: General Surgery

## 2023-12-25 LAB — BASIC METABOLIC PANEL WITH GFR
Anion gap: 14 (ref 5–15)
BUN: 6 mg/dL (ref 6–20)
CO2: 23 mmol/L (ref 22–32)
Calcium: 9.6 mg/dL (ref 8.9–10.3)
Chloride: 98 mmol/L (ref 98–111)
Creatinine, Ser: 0.65 mg/dL (ref 0.61–1.24)
GFR, Estimated: >60 mL/min (ref >60)
Glucose, Bld: 111 mg/dL — ABNORMAL HIGH (ref 70–99)
Potassium: 4.4 mmol/L (ref 3.5–5.1)
Sodium: 134 mmol/L — ABNORMAL LOW (ref 135–145)

## 2023-12-25 LAB — CBC
HCT: 45.2 % (ref 39.0–52.0)
Hemoglobin: 14.5 g/dL (ref 13.0–17.0)
MCH: 28.2 pg (ref 26.0–34.0)
MCHC: 32.1 g/dL (ref 30.0–36.0)
MCV: 87.8 fL (ref 80.0–100.0)
Platelets: 327 K/uL (ref 150–400)
RBC: 5.15 MIL/uL (ref 4.22–5.81)
RDW: 13.1 % (ref 11.5–15.5)
WBC: 13.5 K/uL — ABNORMAL HIGH (ref 4.0–10.5)
nRBC: 0 % (ref 0.0–0.2)

## 2023-12-25 NOTE — Progress Notes (Signed)
 1 Day Post-Op   Subjective/Chief Complaint: Doing fine, no output from stoma   Objective: Vital signs in last 24 hours: Temp:  [97.4 F (36.3 C)-98.9 F (37.2 C)] 98 F (36.7 C) (08/30 0652) Pulse Rate:  [50-75] 60 (08/30 0652) Resp:  [9-18] 16 (08/30 0652) BP: (123-157)/(86-110) 125/86 (08/30 0652) SpO2:  [94 %-100 %] 100 % (08/30 9347) Last BM Date : 12/24/23 (ostomy)  Intake/Output from previous day: 08/29 0701 - 08/30 0700 In: 2895.4 [I.V.:2545.6; IV Piggyback:349.8] Out: 4160 [Urine:4150; Blood:10] Intake/Output this shift: Total I/O In: -  Out: 5 [Emesis/NG output:5]  General nad Cv regular Pulm effort normal, no wheezing Ab soft approp tender dressing dry, stoma pink no output yet  Lab Results:  Recent Labs    12/24/23 0407 12/25/23 0547  WBC 5.6 13.5*  HGB 13.0 14.5  HCT 40.8 45.2  PLT 356 327   BMET Recent Labs    12/24/23 0407 12/25/23 0547  NA 138 134*  K 3.9 4.4  CL 100 98  CO2 26 23  GLUCOSE 89 111*  BUN 9 6  CREATININE 0.83 0.65  CALCIUM  9.3 9.6   PT/INR No results for input(s): LABPROT, INR in the last 72 hours. ABG No results for input(s): PHART, HCO3 in the last 72 hours.  Invalid input(s): PCO2, PO2  Studies/Results: CT ABDOMEN PELVIS W CONTRAST Result Date: 12/24/2023 EXAM: CT ABDOMEN AND PELVIS WITH CONTRAST 12/24/2023 05:18:51 AM TECHNIQUE: CT of the abdomen and pelvis was performed with the administration of intravenous contrast. Multiplanar reformatted images are provided for review. Automated exposure control, iterative reconstruction, and/or weight-based adjustment of the mA/kV was utilized to reduce the radiation dose to as low as reasonably achievable. COMPARISON: 10/23/2023 CLINICAL HISTORY: Abdominal pain, acute, nonlocalized; lower abdominal pain in setting of colostomy recently placed due to volvulus. Lower abd pain for a couple hours, s/p sigmoid colon resection with colostomy 10/23/23, wbc's 5.6, GFR>60, prev  ct a/p 10/23/23. FINDINGS: LOWER CHEST: Asymmetric elevation of the left hemidiaphragm. LIVER: Normal size and contour. GALLBLADDER AND BILE DUCTS: No wall thickening. No cholelithiasis. No biliary ductal dilatation. SPLEEN: Normal size. No focal lesion. PANCREAS: No mass. No ductal dilatation. ADRENAL GLANDS: Normal appearance. No mass. KIDNEYS, URETERS AND BLADDER: Nonobstructing stone within upper pole of right kidney measures 2.4 mm. No hydronephrosis. No perinephric or periureteral stranding. Urinary bladder is unremarkable. GI AND BOWEL: Moderate distention of the gastric lumen which appears similar to the previous exam with air-fluid level noted. Multiple abnormally dilated loops of small bowel with air-fluid levels within the abdomen concerning for small bowel obstruction. There is an abrupt transition point to decrease caliber small bowel along within the left lower quadrant of the abdomen coronal image 59/7 and axial image 57/2. Within the adjacent mesenteric vascular pedicle there is counterclockwise twisting of the vessels, image 54/2. Persistent moderate distention in the ascending colon as well as the transverse colon with moderate retained stool. Since the previous exam there has been interval left colostomy.The colon proximal to the ostomy appears nondilated. PERITONEUM AND RETROPERITONEUM: No free fluid or focal fluid collections. No signs of pneumoperitoneum. VASCULATURE: Patent abdominal vascularity with Counterclockwise twisting of the mesenteric vascular pedicle LYMPH NODES: No lymphadenopathy. REPRODUCTIVE ORGANS: Prostate gland appears enlarged. BONES AND SOFT TISSUES: No acute osseous abnormality. No focal soft tissue abnormality. IMPRESSION: 1. Small bowel obstruction with an abrupt transition point in the left lower quadrant and associated counterclockwise twisting of the adjacent mesenteric vascular pedicle, concerning for volvulus. 2. Moderate distention of  the gastric lumen with air-fluid  level, similar to the previous exam. 3. Status post left colostomy. Persistent moderate distention of the ascending and transverse colon with moderate retained stool. The colon proximal to the ostomy appears nondilated. Electronically signed by: Waddell Calk MD 12/24/2023 06:05 AM EDT RP Workstation: [id number]    Anti-infectives: Anti-infectives (From admission, onward)    Start     Dose/Rate Route Frequency Ordered Stop   12/24/23 0815  piperacillin -tazobactam (ZOSYN ) IVPB 3.375 g        3.375 g 12.5 mL/hr over 240 Minutes Intravenous On call to O.R. 12/24/23 0810 12/24/23 0940       Assessment/Plan: POD 1 elap with reduction small bowel volvulus -cont ng/npo until some bowel function -oob, pulm toilet -ciwa -lovenox  -dc foley   Robert Duffy 12/25/2023

## 2023-12-25 NOTE — Plan of Care (Signed)
 Pt affect calm and cooperative. He does not have any current c/o pain. He states that he has some chest tightness and SOB that were relieved when he lied down. Vitals were checked and normal. Tolerating IV medications well. CIWA protocol in place. Will continue to monitor for progress or changes.

## 2023-12-25 NOTE — Plan of Care (Signed)
   Problem: Activity: Goal: Risk for activity intolerance will decrease Outcome: Progressing   Problem: Nutrition: Goal: Adequate nutrition will be maintained Outcome: Progressing   Problem: Coping: Goal: Level of anxiety will decrease Outcome: Progressing

## 2023-12-26 MED ORDER — ACETAMINOPHEN 500 MG PO TABS
1000.0000 mg | ORAL_TABLET | Freq: Four times a day (QID) | ORAL | Status: DC
  Administered 2023-12-26 – 2023-12-29 (×11): 1000 mg via ORAL
  Filled 2023-12-26 (×11): qty 2

## 2023-12-26 MED ORDER — BOOST / RESOURCE BREEZE PO LIQD CUSTOM
1.0000 | Freq: Three times a day (TID) | ORAL | Status: DC
  Administered 2023-12-26 – 2023-12-29 (×10): 1 via ORAL

## 2023-12-26 MED ORDER — OXYCODONE HCL 5 MG PO TABS: 10.0000 mg | ORAL_TABLET | ORAL | Status: DC | PRN

## 2023-12-26 NOTE — Plan of Care (Signed)

## 2023-12-26 NOTE — Progress Notes (Signed)
 2 Days Post-Op   Subjective/Chief Complaint: Ng tube removed by nursing without order, had some flatus, sore, ambulating   Objective: Vital signs in last 24 hours: Temp:  [97.5 F (36.4 C)-98.3 F (36.8 C)] 98.1 F (36.7 C) (08/31 0523) Pulse Rate:  [62-74] 62 (08/31 0523) Resp:  [16] 16 (08/31 0523) BP: (127-135)/(90-91) 135/90 (08/31 0523) SpO2:  [98 %-100 %] 99 % (08/31 0523) Last BM Date : 12/24/23 (ostomy)  Intake/Output from previous day: 08/30 0701 - 08/31 0700 In: 850 [P.O.:850] Out: 5 [Emesis/NG output:5] Intake/Output this shift: No intake/output data recorded.  General nad Cv regular Pulm effort normal, no wheezing Ab soft approp tender dressing dry, stoma pink with some air  Lab Results:  Recent Labs    12/24/23 0407 12/25/23 0547  WBC 5.6 13.5*  HGB 13.0 14.5  HCT 40.8 45.2  PLT 356 327   BMET Recent Labs    12/24/23 0407 12/25/23 0547  NA 138 134*  K 3.9 4.4  CL 100 98  CO2 26 23  GLUCOSE 89 111*  BUN 9 6  CREATININE 0.83 0.65  CALCIUM  9.3 9.6   PT/INR No results for input(s): LABPROT, INR in the last 72 hours. ABG No results for input(s): PHART, HCO3 in the last 72 hours.  Invalid input(s): PCO2, PO2  Studies/Results: No results found.  Anti-infectives: Anti-infectives (From admission, onward)    Start     Dose/Rate Route Frequency Ordered Stop   12/24/23 0815  piperacillin -tazobactam (ZOSYN ) IVPB 3.375 g        3.375 g 12.5 mL/hr over 240 Minutes Intravenous On call to O.R. 12/24/23 0810 12/24/23 0940       Assessment/Plan: POD 2 elap with reduction small bowel volvulus -ng tube removed without order although on documentation of this in chart -he has some air in bag so will try clears -oob, pulm toilet -ciwa -lovenox      Donnice Bury 12/26/2023

## 2023-12-26 NOTE — Plan of Care (Incomplete)
  Problem: Clinical Measurements: Goal: Ability to maintain clinical measurements within normal limits will improve Outcome: Progressing Goal: Will remain free from infection Outcome: Progressing Goal: Diagnostic test results will improve Outcome: Progressing Goal: Respiratory complications will improve Outcome: Progressing Goal: Cardiovascular complication will be avoided Outcome: Progressing   Problem: Activity: Goal: Risk for activity intolerance will decrease Outcome: Progressing   Problem: Coping: Goal: Level of anxiety will decrease Outcome: Progressing   Problem: Elimination: Goal: Will not experience complications related to bowel motility Outcome: Progressing Goal: Will not experience complications related to urinary retention Outcome: Progressing

## 2023-12-26 NOTE — Progress Notes (Signed)
   12/26/23 1217  TOC Brief Assessment  Home environment has been reviewed Home alone  Prior level of function: independent  Prior/Current Home Services No current home services  Social Drivers of Health Review SDOH reviewed no interventions necessary  Readmission risk has been reviewed Yes  Transition of care needs transition of care needs identified, TOC will continue to follow

## 2023-12-27 MED ORDER — METHOCARBAMOL 500 MG PO TABS
500.0000 mg | ORAL_TABLET | Freq: Three times a day (TID) | ORAL | Status: DC
  Administered 2023-12-27 – 2023-12-29 (×6): 500 mg via ORAL
  Filled 2023-12-27 (×6): qty 1

## 2023-12-27 MED ORDER — POLYETHYLENE GLYCOL 3350 17 G PO PACK
17.0000 g | PACK | Freq: Every day | ORAL | Status: DC
  Administered 2023-12-27 – 2023-12-29 (×3): 17 g via ORAL
  Filled 2023-12-27 (×3): qty 1

## 2023-12-27 MED ORDER — METHOCARBAMOL 1000 MG/10ML IJ SOLN: 500.0000 mg | Freq: Three times a day (TID) | INTRAMUSCULAR | Status: DC

## 2023-12-27 NOTE — Plan of Care (Signed)

## 2023-12-27 NOTE — Progress Notes (Signed)
 3 Days Post-Op   Subjective/Chief Complaint: Doing well, a lot of flatus, tol clears, ambulating, pain controlled   Objective: Vital signs in last 24 hours: Temp:  [97.9 F (36.6 C)-98 F (36.7 C)] 98 F (36.7 C) (09/01 0733) Pulse Rate:  [61-75] 61 (09/01 0733) Resp:  [15-18] 15 (09/01 0733) BP: (114-130)/(75-88) 114/75 (09/01 0733) SpO2:  [97 %-100 %] 97 % (09/01 0733) Last BM Date : 12/26/23  Intake/Output from previous day: 08/31 0701 - 09/01 0700 In: 1680 [P.O.:1680] Out: 0  Intake/Output this shift: No intake/output data recorded.  General nad CV regular Pulm effort normal Ab soft approp tender dressing dry, stoma pink  Lab Results:  Recent Labs    12/25/23 0547  WBC 13.5*  HGB 14.5  HCT 45.2  PLT 327   BMET Recent Labs    12/25/23 0547  NA 134*  K 4.4  CL 98  CO2 23  GLUCOSE 111*  BUN 6  CREATININE 0.65  CALCIUM  9.6   PT/INR No results for input(s): LABPROT, INR in the last 72 hours. ABG No results for input(s): PHART, HCO3 in the last 72 hours.  Invalid input(s): PCO2, PO2  Studies/Results: No results found.  Anti-infectives: Anti-infectives (From admission, onward)    Start     Dose/Rate Route Frequency Ordered Stop   12/24/23 0815  piperacillin -tazobactam (ZOSYN ) IVPB 3.375 g        3.375 g 12.5 mL/hr over 240 Minutes Intravenous On call to O.R. 12/24/23 0810 12/24/23 0940       Assessment/Plan: POD 3 elap with reduction small bowel volvulus -advance to fulls, will do some miralax  -oob, pulm toilet -ciwa -lovenox  -hopefully home next 48 hours  Donnice Bury 12/27/2023

## 2023-12-28 LAB — CBC
HCT: 40.2 % (ref 39.0–52.0)
Hemoglobin: 12.8 g/dL — ABNORMAL LOW (ref 13.0–17.0)
MCH: 27.4 pg (ref 26.0–34.0)
MCHC: 31.8 g/dL (ref 30.0–36.0)
MCV: 86.1 fL (ref 80.0–100.0)
Platelets: 371 K/uL (ref 150–400)
RBC: 4.67 MIL/uL (ref 4.22–5.81)
RDW: 13 % (ref 11.5–15.5)
WBC: 6 K/uL (ref 4.0–10.5)
nRBC: 0 % (ref 0.0–0.2)

## 2023-12-28 LAB — BASIC METABOLIC PANEL WITH GFR
Anion gap: 13 (ref 5–15)
BUN: 8 mg/dL (ref 6–20)
CO2: 24 mmol/L (ref 22–32)
Calcium: 9.6 mg/dL (ref 8.9–10.3)
Chloride: 99 mmol/L (ref 98–111)
Creatinine, Ser: 0.59 mg/dL — ABNORMAL LOW (ref 0.61–1.24)
GFR, Estimated: >60 mL/min (ref >60)
Glucose, Bld: 105 mg/dL — ABNORMAL HIGH (ref 70–99)
Potassium: 4.2 mmol/L (ref 3.5–5.1)
Sodium: 136 mmol/L (ref 135–145)

## 2023-12-28 NOTE — Discharge Instructions (Signed)
 [facility]      [facility], [place] [phone]  OPEN ABDOMINAL SURGERY: POST OP INSTRUCTIONS  Always review your discharge instruction sheet given to you by the facility where your surgery was performed.  IF YOU HAVE DISABILITY OR FAMILY LEAVE FORMS, YOU MUST BRING THEM TO THE OFFICE FOR PROCESSING.  PLEASE DO NOT GIVE THEM TO YOUR DOCTOR.  A prescription for pain medication may be given to you upon discharge.  Take your pain medication as prescribed, if needed.  If narcotic pain medicine is not needed, then you may take acetaminophen  (Tylenol ) or ibuprofen (Advil) as needed. Take your usually prescribed medications unless otherwise directed. If you need a refill on your pain medication, please contact your pharmacy. They will contact our office to request authorization.  Prescriptions will not be filled after 5pm or on week-ends. You should follow a light diet the first few days after arrival home, such as soup and crackers, pudding, etc.unless your doctor has advised otherwise. A high-fiber, low fat diet can be resumed as tolerated.   Be sure to include lots of fluids daily. Most patients will experience some swelling and bruising on the chest and neck area.  Ice packs will help.  Swelling and bruising can take several days to resolve Most patients will experience some swelling and bruising in the area of the incision. Ice pack will help. Swelling and bruising can take several days to resolve..  It is common to experience some constipation if taking pain medication after surgery.  Increasing fluid intake and taking a stool softener will usually help or prevent this problem from occurring.  A mild laxative (Milk of Magnesia or Miralax) should be taken according to package directions if there are no bowel movements after 48 hours.  You may have steri-strips (small skin tapes) in place directly over the incision.  These strips should be left on the skin for 7-10 days.  If your surgeon used skin  glue on the incision, you may shower in 24 hours.  The glue will flake off over the next 2-3 weeks.  Any sutures or staples will be removed at the office during your follow-up visit. You may find that a light gauze bandage over your incision may keep your staples from being rubbed or pulled. You may shower and replace the bandage daily. ACTIVITIES:  You may resume regular (light) daily activities beginning the next day--such as daily self-care, walking, climbing stairs--gradually increasing activities as tolerated.  You may have sexual intercourse when it is comfortable.  Refrain from any heavy lifting or straining until approved by your doctor. You may drive when you no longer are taking prescription pain medication, you can comfortably wear a seatbelt, and you can safely maneuver your car and apply brakes Return to Work: ___________________________________ Robert Duffy should see your doctor in the office for a follow-up appointment approximately two weeks after your surgery.  Make sure that you call for this appointment within a day or two after you arrive home to insure a convenient appointment time. OTHER INSTRUCTIONS:  _____________________________________________________________ _____________________________________________________________  WHEN TO CALL YOUR DOCTOR: Fever over 101.0 Inability to urinate Nausea and/or vomiting Extreme swelling or bruising Continued bleeding from incision. Increased pain, redness, or drainage from the incision. Difficulty swallowing or breathing Muscle cramping or spasms. Numbness or tingling in hands or feet or around lips.  The clinic staff is available to answer your questions during regular business hours.  Please don't hesitate to call and ask to speak to one of  the nurses if you have concerns.  For further questions, please visit [email or web address]

## 2023-12-28 NOTE — Progress Notes (Addendum)
    4 Days Post-Op  Subjective: CC: Reports incisional pain that is well controlled. Tolerating fld without n/v, worsening abdominal pain or distension/bloating. Having air and liquid stool in ostomy bag. Voiding. Mobilizing.   Objective: Vital signs in last 24 hours: Temp:  [97.9 F (36.6 C)-98.3 F (36.8 C)] 97.9 F (36.6 C) (09/02 0616) Pulse Rate:  [65-73] 65 (09/02 0616) Resp:  [17-18] 17 (09/02 0616) BP: (111-122)/(75-81) 112/75 (09/02 0616) SpO2:  [99 %] 99 % (09/02 0616) Last BM Date : 12/28/23  Intake/Output from previous day: 09/01 0701 - 09/02 0700 In: 1200 [P.O.:1200] Out: -  Intake/Output this shift: Total I/O In: 360 [P.O.:360] Out: 0   PE: Gen:  Alert, NAD, pleasant Card:  Reg Pulm:  Rate and effort normal Abd: Soft, mild distension, appropriately ttp around incision, otherwise NT. Stoma viable. Ostomy bag with air and liquid stool in bag. Midline wound with honeycomb dressing in place w/ area of dried blood but no signs of active bleeding.   Lab Results:  Recent Labs    12/28/23 0430  WBC 6.0  HGB 12.8*  HCT 40.2  PLT 371   BMET Recent Labs    12/28/23 0430  NA 136  K 4.2  CL 99  CO2 24  GLUCOSE 105*  BUN 8  CREATININE 0.59*  CALCIUM  9.6   PT/INR No results for input(s): LABPROT, INR in the last 72 hours. CMP     Component Value Date/Time   NA 136 12/28/2023 0430   K 4.2 12/28/2023 0430   CL 99 12/28/2023 0430   CO2 24 12/28/2023 0430   GLUCOSE 105 (H) 12/28/2023 0430   BUN 8 12/28/2023 0430   CREATININE 0.59 (L) 12/28/2023 0430   CALCIUM  9.6 12/28/2023 0430   PROT 8.1 12/24/2023 0407   ALBUMIN 4.5 12/24/2023 0407   AST 50 (H) 12/24/2023 0407   ALT 54 (H) 12/24/2023 0407   ALKPHOS 80 12/24/2023 0407   BILITOT 0.4 12/24/2023 0407   GFRNONAA >60 12/28/2023 0430   GFRAA >60 12/27/2019 1557   Lipase     Component Value Date/Time   LIPASE 13 12/24/2023 0407    Studies/Results: No results  found.  Anti-infectives: Anti-infectives (From admission, onward)    Start     Dose/Rate Route Frequency Ordered Stop   12/24/23 0815  piperacillin -tazobactam (ZOSYN ) IVPB 3.375 g        3.375 g 12.5 mL/hr over 240 Minutes Intravenous On call to O.R. 12/24/23 0810 12/24/23 0940        Assessment/Plan POD 4 s/p ex lap with reduction of small bowel volvulus by Dr. Curvin on 8/29 - Adv to soft diet - He reports he is proficient in ostomy care. Will hold on WOCN consult - CIWA - Mobilize, pulm toilet.  - Possible d/c later today vs tomorrow if tolerating soft diet  FEN - Soft diet.  VTE - SCDs,  ID - Zosyn  peri-op. None currently.  Foley - None, spont void   LOS: 4 days    Ozell CHRISTELLA Shaper, [facility] 12/28/2023, 11:34 AM Please see Amion for pager number during day hours 7:00am-4:30pm

## 2023-12-28 NOTE — Plan of Care (Signed)
  Problem: Coping: Goal: Level of anxiety will decrease Outcome: Progressing   Problem: Elimination: Goal: Will not experience complications related to bowel motility Outcome: Progressing Goal: Will not experience complications related to urinary retention Outcome: Progressing   Problem: Pain Managment: Goal: General experience of comfort will improve and/or be controlled Outcome: Progressing

## 2023-12-29 ENCOUNTER — Other Ambulatory Visit (HOSPITAL_COMMUNITY): Payer: Self-pay

## 2023-12-29 MED ORDER — IBUPROFEN 200 MG PO TABS
600.0000 mg | ORAL_TABLET | Freq: Three times a day (TID) | ORAL | 0 refills | Status: AC | PRN
  Filled 2023-12-29: qty 20, 2d supply, fill #0

## 2023-12-29 MED ORDER — ADULT MULTIVITAMIN W/MINERALS CH
1.0000 | ORAL_TABLET | Freq: Every day | ORAL | 0 refills | Status: AC
  Filled 2023-12-29: qty 30, 30d supply, fill #0

## 2023-12-29 MED ORDER — VITAMIN B-1 100 MG PO TABS
100.0000 mg | ORAL_TABLET | Freq: Every day | ORAL | 0 refills | Status: AC
  Filled 2023-12-29: qty 30, 30d supply, fill #0

## 2023-12-29 MED ORDER — METHOCARBAMOL 500 MG PO TABS
500.0000 mg | ORAL_TABLET | Freq: Four times a day (QID) | ORAL | 0 refills | Status: AC | PRN
  Filled 2023-12-29: qty 30, 8d supply, fill #0

## 2023-12-29 MED ORDER — FOLIC ACID 1 MG PO TABS
1.0000 mg | ORAL_TABLET | Freq: Every day | ORAL | 0 refills | Status: AC
  Filled 2023-12-29: qty 30, 30d supply, fill #0

## 2023-12-29 MED ORDER — ACETAMINOPHEN 500 MG PO TABS
1000.0000 mg | ORAL_TABLET | Freq: Three times a day (TID) | ORAL | 0 refills | Status: AC | PRN
  Filled 2023-12-29: qty 40, 7d supply, fill #0

## 2023-12-29 NOTE — TOC Initial Note (Signed)
 Transition of Care [facility]) - Initial/Assessment Note    Patient Details  Name: Robert Duffy MRN: [id number] Date of Birth: April 23, 1981  Transition of Care [facility]) CM/SW Contact:    Alfonse JONELLE Rex, RN Phone Number: 12/29/2023, 2:25 PM  Clinical Narrative:    DC to home order. No TOC needs identified at this time.                  Barriers to Discharge: Barriers Resolved   Patient Goals and CMS Choice            Expected Discharge Plan and Services         Expected Discharge Date: 12/29/23                                    Prior Living Arrangements/Services                       Activities of Daily Living   ADL Screening (condition at time of admission) Independently performs ADLs?: Yes (appropriate for developmental age) Is the patient deaf or have difficulty hearing?: No Does the patient have difficulty seeing, even when wearing glasses/contacts?: No Does the patient have difficulty concentrating, remembering, or making decisions?: No  Permission Sought/Granted                  Emotional Assessment              Admission diagnosis:  Volvulus (HCC) [K56.2] Lower abdominal pain [R10.30] 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 [place] [address] Phone: [phone] Fax: [phone]     Social Drivers of Health (SDOH) Social History: SDOH Screenings   Food Insecurity: No Food Insecurity (12/24/2023)  Housing: High Risk (12/24/2023)  Transportation  Needs: Unmet Transportation Needs (12/24/2023)  Utilities: Not At Risk (12/24/2023)  Tobacco Use: High Risk (12/24/2023)   SDOH Interventions:     Readmission Risk Interventions    12/26/2023   12:16 PM 10/28/2023    5:02 PM  Readmission Risk Prevention Plan  Transportation Screening Complete Complete  PCP or Specialist Appt within 5-7 Days Complete Complete  Home Care Screening Complete Complete  Medication Review (RN CM) Complete Complete

## 2023-12-29 NOTE — Progress Notes (Signed)
 Discharge meds in a secure bag delivered to pt in room by this RN

## 2023-12-29 NOTE — Plan of Care (Signed)
  Problem: Nutrition: Goal: Adequate nutrition will be maintained Outcome: Completed/Met   Problem: Elimination: Goal: Will not experience complications related to urinary retention Outcome: Completed/Met   Problem: Pain Managment: Goal: General experience of comfort will improve and/or be controlled Outcome: Adequate for Discharge

## 2023-12-29 NOTE — Discharge Summary (Signed)
 Patient ID: Robert Duffy [id number] 13-Jan-1981 43 y.o.  Admit date: 12/24/2023 Discharge date: 12/29/2023  Admitting Diagnosis: Small bowel volvulus   Discharge Diagnosis S/p ex lap with reduction of small bowel volvulus by Dr. 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  He has been doing well and just saw Dr. Dasie in the office 3 days ago with no issues.  He states he woke up early this morning with some abdominal pain.  He admits to some dry heaving, but no vomiting.  He is not a great historian so difficult to get much more pertinent information.  His colostomy seems to have slowed overnight.  He is unsure if he has bloating.  He admits to chest pain, but states this has been present for 3 years since an accident.  He is also unaware of when the rash/redness on his chest wall started.  He lives in an acquaintance's building.  He is otherwise homeless.  He is estranged from his siblings and his parents are deceased.     He presented to the ED due to pain in his abdomen.  He has normal labs, but a CT scan concerning for SB volvulus with significant proximal SB and gastric distention.  We have been asked to see him for further recommendations.   Procedures Dr. Curvin - 12/24/23 Exploratory laparotomy with reduction of small bowel volvulus  Hospital Course:  Patient presented as above and was taken to the OR where he underwent Exploratory laparotomy with reduction of small bowel volvulus. Patient tolerated the procedure well and was transferred to the floor post op. Patient remained with NGT post op. On POD 2 NGT was removed and diet was advanced. Diet was advanced and tolerated. On POD 5, the patient was voiding well, tolerating diet, ambulating well,  pain well controlled, vital signs stable, incisions c/d/i and felt stable for discharge home.   Patient reports he would not like any narcotics at d/c. I sent non-narcotic pain medication to his pharmacy. He reports he is comfortable with returning to prior living situation and discharge.   Physical Exam: Gen:  Alert, NAD, pleasant Pulm:  Rate and effort normal Abd: Soft, mild distension, appropriately ttp around incision, otherwise NT. Stoma viable. Ostomy bag with air and liquid stool in bag. Midline wound with honeycomb dressing in place w/ area of dried blood but no signs of active bleeding - will have honeycomb dressing be removed before discharged.   Allergies as of 12/29/2023       Reactions   Other Shortness Of Breath, Itching, Other (See Comments)   Cannot tolerate strong smells- Car exhaust, Clorox, etc... Itchy eyes, other allergic symptoms, etc..        Medication List     TAKE these medications    acetaminophen  500 MG tablet Commonly known as: TYLENOL  Take 2 tablets (1,000 mg total) by mouth every 8 (eight) hours as needed. What changed: when to take this   docusate sodium  100 MG capsule Commonly known as: COLACE Take 1 capsule (100 mg total) by mouth 2 (two) times daily.   famotidine  20 MG tablet Commonly known as: PEPCID  Take 1 tablet (20 mg total) by mouth 2 (two) times daily.   folic acid  1 MG tablet Commonly  known as: FOLVITE  Take 1 tablet (1 mg total) by mouth daily. Start taking on: December 30, 2023   ibuprofen  200 MG tablet Commonly known as: ADVIL  Take 3 tablets (600 mg total) by mouth every 8 (eight) hours as needed for up to 5 days for mild pain (pain score 1-3) or moderate pain (pain score 4-6).   methocarbamol  500 MG tablet Commonly known as: ROBAXIN  Take 1 tablet (500 mg total) by mouth every 6 (six) hours as needed (use for muscle cramps/pain). What changed:  medication strength how much to take   multivitamin with minerals Tabs  tablet Take 1 tablet by mouth daily. Start taking on: December 30, 2023   Ostomy Supplies Kit 1 kit by Does not apply route once a week.   Ostomy Supplies Pouch Misc 1 Application by Does not apply route once a week.   Ostomy Belt Large Misc 1 kit by Does not apply route daily as needed.   pantoprazole  40 MG tablet Commonly known as: Protonix  Take 1 tablet (40 mg total) by mouth daily for 28 days.   thiamine  100 MG tablet Commonly known as: Vitamin B-1 Take 1 tablet (100 mg total) by mouth daily. Start taking on: December 30, 2023          Follow-up Information     Curvin Mt III, MD Follow up on 01/19/2024.   Specialty: General Surgery Why: 11:10am, Arrive 30 minutes prior to your appointment time, Please bring your insurance card and photo ID Contact information: [address] [place] [address] [phone]         [facility] Follow up on 01/07/2024.   Specialty: General Surgery Why: 9am. This is a nurse visit for staple removal. Please bring a copy of your photo ID and insurance card. Please arrive 30 minutes prior to your appointment for paperwork. Contact information: [address] [place] [address] [phone]                 Signed: Ozell CHRISTELLA Shaper, [facility] 12/29/2023, 10:47 AM Please see Amion for pager number during day hours 7:00am-4:30pm

## 2023-12-29 NOTE — Progress Notes (Signed)
 AVS reviewed w/ patient who verbalized an understanding . [employer] on AVS. Pt has a bus pass- requested a pair of shoes. This RN will Ecologist. PIV x 2 removed as noted. Pt dressing for d/c to home. Pt has access to shower daily, currently lives in a shed at the back of a property- owner aware. Scissors supplied for ostomy care. No other questions at this time

## 2024-01-31 ENCOUNTER — Other Ambulatory Visit: Payer: Self-pay

## 2024-01-31 ENCOUNTER — Emergency Department (HOSPITAL_COMMUNITY)
Admission: EM | Admit: 2024-01-31 | Discharge: 2024-01-31 | Disposition: A | Discharge status: Home / Self Care | Attending: Emergency Medicine | Admitting: Emergency Medicine

## 2024-01-31 ENCOUNTER — Encounter (HOSPITAL_COMMUNITY): Payer: Self-pay

## 2024-01-31 DIAGNOSIS — Z4800 Encounter for change or removal of nonsurgical wound dressing: Secondary | ICD-10-CM | POA: Diagnosis present

## 2024-01-31 DIAGNOSIS — Z5189 Encounter for other specified aftercare: Secondary | ICD-10-CM

## 2024-01-31 NOTE — ED Provider Notes (Signed)
  [place] EMERGENCY DEPARTMENT AT [facility] Provider Note   CSN: [id number] Arrival date & time: 01/31/24  9051     Patient presents with: Needs Supplies   Robert Duffy is a 43 y.o. male.   HPI   Patient with a left lower quadrant colostomy presents with request for supplies.  He has no physical complaints, is scheduled for reversal consultation next week, is awake, alert, providing his own history.  Prior to Admission medications   Medication Sig Start Date End Date Taking? Authorizing Provider  acetaminophen  (TYLENOL ) 500 MG tablet Take 2 tablets (1,000 mg total) by mouth every 8 (eight) hours as needed. 12/29/23   Maczis, Michael M, PA-C  docusate sodium  (COLACE) 100 MG capsule Take 1 capsule (100 mg total) by mouth 2 (two) times daily. Patient not taking: Reported on 12/24/2023 10/28/23   [name], PA-C  famotidine  (PEPCID ) 20 MG tablet Take 1 tablet (20 mg total) by mouth 2 (two) times daily. Patient not taking: Reported on 12/24/2023 07/18/20   Geiple, Joshua, PA-C  folic acid  (FOLVITE ) 1 MG tablet Take 1 tablet (1 mg total) by mouth daily. 12/30/23   Maczis, Michael M, PA-C  methocarbamol  (ROBAXIN ) 500 MG tablet Take 1 tablet (500 mg total) by mouth every 6 (six) hours as needed (use for muscle cramps/pain). 12/29/23   Maczis, Michael M, PA-C  Multiple Vitamin (MULTIVITAMIN WITH MINERALS) TABS tablet Take 1 tablet by mouth daily. 12/30/23   Maczis, Michael M, PA-C  Ostomy Supplies (OSTOMY BELT LARGE) MISC 1 kit by Does not apply route daily as needed. 11/18/23 11/17/24  [name], FNP  Ostomy Supplies KIT 1 kit by Does not apply route once a week. 11/18/23 02/16/24  [name], FNP  Ostomy Supplies Pouch MISC 1 Application by Does not apply route once a week. 11/18/23 11/17/24  [name], FNP  pantoprazole  (PROTONIX ) 40 MG tablet Take 1 tablet (40 mg total) by mouth daily for 28 days. Patient not taking: Reported on 12/24/2023 12/19/22 12/24/23  [name],  PA-C  thiamine  (VITAMIN B-1) 100 MG tablet Take 1 tablet (100 mg total) by mouth daily. 12/30/23   Maczis, Michael M, PA-C    Allergies: Other    Review of Systems  Updated Vital Signs BP (!) 136/107   Pulse 100   Temp 98.5 F (36.9 C)   Resp 19   Ht 1.803 m (5' 11)   Wt 75 kg   SpO2 97%   BMI 23.06 kg/m   Physical Exam Abdominal:     Comments: Ostomy unremarkable in appearance     (all labs ordered are listed, but only abnormal results are displayed) Labs Reviewed - No data to display  EKG: None  Radiology: No results found.   Procedures   Medications Ordered in the ED - No data to display                                  Medical Decision Making  Patient without complaints, hemodynamic instability, with overtly unremarkable ostomy presents for supplies.  Final diagnoses:  Visit for wound care    ED Discharge Orders     None          Garrick Charleston, MD 01/31/24 1020

## 2024-01-31 NOTE — ED Triage Notes (Signed)
 Patient said he needs a few colostomies bags to get him through the week. No pain. [place] he is unable to find his supplies.

## 2024-01-31 NOTE — Discharge Instructions (Signed)
 Be sure to keep your appointment with your surgeon next week.  Return here for concerning changes in your condition.

## 2024-02-14 ENCOUNTER — Encounter (HOSPITAL_COMMUNITY): Payer: Self-pay | Admitting: Emergency Medicine

## 2024-02-14 ENCOUNTER — Other Ambulatory Visit: Payer: Self-pay

## 2024-02-14 ENCOUNTER — Emergency Department (HOSPITAL_COMMUNITY)

## 2024-02-14 ENCOUNTER — Emergency Department (HOSPITAL_COMMUNITY)
Admission: EM | Admit: 2024-02-14 | Discharge: 2024-02-14 | Disposition: A | Discharge status: Home / Self Care | Attending: Emergency Medicine | Admitting: Emergency Medicine

## 2024-02-14 DIAGNOSIS — R079 Chest pain, unspecified: Secondary | ICD-10-CM | POA: Insufficient documentation

## 2024-02-14 DIAGNOSIS — R109 Unspecified abdominal pain: Secondary | ICD-10-CM | POA: Diagnosis present

## 2024-02-14 DIAGNOSIS — Z720 Tobacco use: Secondary | ICD-10-CM | POA: Insufficient documentation

## 2024-02-14 DIAGNOSIS — K5900 Constipation, unspecified: Secondary | ICD-10-CM | POA: Insufficient documentation

## 2024-02-14 DIAGNOSIS — Y900 Blood alcohol level of less than 20 mg/100 ml: Secondary | ICD-10-CM | POA: Insufficient documentation

## 2024-02-14 LAB — URINALYSIS, ROUTINE W REFLEX MICROSCOPIC
Bilirubin Urine: NEGATIVE
Glucose, UA: NEGATIVE mg/dL
Hgb urine dipstick: NEGATIVE
Ketones, ur: NEGATIVE mg/dL
Leukocytes,Ua: NEGATIVE
Nitrite: NEGATIVE
Protein, ur: NEGATIVE mg/dL
Specific Gravity, Urine: 1.011 (ref 1.005–1.030)
pH: 6 (ref 5.0–8.0)

## 2024-02-14 LAB — TROPONIN T, HIGH SENSITIVITY
Troponin T High Sensitivity: <15 ng/L (ref 0–19)
Troponin T High Sensitivity: <15 ng/L (ref 0–19)

## 2024-02-14 LAB — COMPREHENSIVE METABOLIC PANEL WITH GFR
ALT: 23 U/L (ref 0–44)
AST: 31 U/L (ref 15–41)
Albumin: 4.4 g/dL (ref 3.5–5.0)
Alkaline Phosphatase: 86 U/L (ref 38–126)
Anion gap: 11 (ref 5–15)
BUN: 14 mg/dL (ref 6–20)
CO2: 26 mmol/L (ref 22–32)
Calcium: 9.6 mg/dL (ref 8.9–10.3)
Chloride: 101 mmol/L (ref 98–111)
Creatinine, Ser: 0.75 mg/dL (ref 0.61–1.24)
GFR, Estimated: >60 mL/min (ref >60)
Glucose, Bld: 82 mg/dL (ref 70–99)
Potassium: 4.2 mmol/L (ref 3.5–5.1)
Sodium: 138 mmol/L (ref 135–145)
Total Bilirubin: 0.2 mg/dL (ref 0.0–1.2)
Total Protein: 8.2 g/dL — ABNORMAL HIGH (ref 6.5–8.1)

## 2024-02-14 LAB — URINE DRUG SCREEN
Amphetamines: NEGATIVE
Barbiturates: NEGATIVE
Benzodiazepines: NEGATIVE
Cocaine: NEGATIVE
Fentanyl: NEGATIVE
Methadone Scn, Ur: NEGATIVE
Opiates: NEGATIVE
Tetrahydrocannabinol: NEGATIVE

## 2024-02-14 LAB — CBC
HCT: 39.7 % (ref 39.0–52.0)
Hemoglobin: 12.4 g/dL — ABNORMAL LOW (ref 13.0–17.0)
MCH: 27.9 pg (ref 26.0–34.0)
MCHC: 31.2 g/dL (ref 30.0–36.0)
MCV: 89.2 fL (ref 80.0–100.0)
Platelets: 400 K/uL (ref 150–400)
RBC: 4.45 MIL/uL (ref 4.22–5.81)
RDW: 12.5 % (ref 11.5–15.5)
WBC: 7.9 K/uL (ref 4.0–10.5)
nRBC: 0 % (ref 0.0–0.2)

## 2024-02-14 LAB — LIPASE, BLOOD: Lipase: 17 U/L (ref 11–51)

## 2024-02-14 LAB — ETHANOL: Alcohol, Ethyl (B): <15 mg/dL (ref <15)

## 2024-02-14 MED ORDER — IOHEXOL 300 MG/ML  SOLN
100.0000 mL | Freq: Once | INTRAMUSCULAR | Status: AC | PRN
  Administered 2024-02-14: 100 mL via INTRAVENOUS

## 2024-02-14 NOTE — ED Notes (Signed)
 Patient transported to CT

## 2024-02-14 NOTE — ED Triage Notes (Addendum)
 Pt reports chest pain and severe abdominal pain and swelling within and around stoma site for one week. Recently chest pain has become sharp and burning. Reports that he missed an appointment to reverse his ostomy and feels that he has internal damage. Stoma is enlarged and bright red. Patient endorses meth use.

## 2024-02-14 NOTE — ED Notes (Signed)
 Ostomy supplies given per EDP.

## 2024-02-14 NOTE — Discharge Instructions (Addendum)
 Follow-up with your general surgeon to discuss reversal of your ostomy, as this was previously scheduled but you missed your appointment. Start Miralax  and use daily for one week to alleviate constipation, increase your fluid intake to avoid worsening constipation. Return to the emergency department if your symptoms worsen.

## 2024-02-14 NOTE — ED Provider Notes (Signed)
 [place] EMERGENCY DEPARTMENT AT [facility] Provider Note   CSN: [id number] Arrival date & time: 02/14/24  1446     Patient presents with: Abdominal Pain and Chest Pain   Robert Duffy is a 43 y.o. male.   43 year old male presenting with abdominal pain and chest pain.  Patient reports that chest pain has been ongoing for years but has been worse over the past week or so, he attributes this to the fact that he has been staying in [employer] trailers and believes that he has been exposed to carbon oxide/gasoline fumes.  He notes worsening abdominal pain and bloating for the past 3 or 4 days, he does endorse meth and alcohol use stating that he drank a beer today to deal with the pain.  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  He was scheduled to have his ostomy reversed but missed this appointment.  He notes that his ostomy output has lessened and that the ostomy itself looks and angry, he describes his abdominal pain as like I have just done a bunch of ab crunches, he states that his pain is exacerbated by movement.  He denies nausea/vomiting/fever.   Abdominal Pain Associated symptoms: chest pain   Chest Pain Associated symptoms: abdominal pain        Prior to Admission medications   Medication Sig Start Date End Date Taking? Authorizing Provider  acetaminophen  (TYLENOL ) 500 MG tablet Take 2 tablets (1,000 mg total) by mouth every 8 (eight) hours as needed. 12/29/23   Maczis, Michael M, PA-C  docusate sodium  (COLACE) 100 MG capsule Take 1 capsule (100 mg total) by mouth 2 (two) times daily. Patient not taking: Reported on 12/24/2023 10/28/23   [name], PA-C  famotidine  (PEPCID ) 20 MG tablet Take 1 tablet (20 mg total) by mouth 2 (two) times daily. Patient not taking: Reported on 12/24/2023 07/18/20   Geiple, Joshua, PA-C   folic acid  (FOLVITE ) 1 MG tablet Take 1 tablet (1 mg total) by mouth daily. 12/30/23   Maczis, Michael M, PA-C  methocarbamol  (ROBAXIN ) 500 MG tablet Take 1 tablet (500 mg total) by mouth every 6 (six) hours as needed (use for muscle cramps/pain). 12/29/23   Maczis, Michael M, PA-C  Multiple Vitamin (MULTIVITAMIN WITH MINERALS) TABS tablet Take 1 tablet by mouth daily. 12/30/23   Maczis, Michael M, PA-C  Ostomy Supplies (OSTOMY BELT LARGE) MISC 1 kit by Does not apply route daily as needed. 11/18/23 11/17/24  [name], FNP  Ostomy Supplies KIT 1 kit by Does not apply route once a week. 11/18/23 02/16/24  [name], FNP  Ostomy Supplies Pouch MISC 1 Application by Does not apply route once a week. 11/18/23 11/17/24  [name], FNP  pantoprazole  (PROTONIX ) 40 MG tablet Take 1 tablet (40 mg total) by mouth daily for 28 days. Patient not taking: Reported on 12/24/2023 12/19/22 12/24/23  [name], PA-C  thiamine  (VITAMIN B-1) 100 MG tablet Take 1 tablet (100 mg total) by mouth daily. 12/30/23   Maczis, Michael M, PA-C    Allergies: Other    Review of Systems  Cardiovascular:  Positive for chest pain.  Gastrointestinal:  Positive for abdominal pain.    Updated Vital Signs BP 128/89 (BP Location: Left Arm)   Pulse 70   Temp 97.8 F (36.6 C) (Oral)   Resp 16  SpO2 100%   Physical Exam Vitals and nursing note reviewed.  HENT:     Head: Normocephalic.  Eyes:     Extraocular Movements: Extraocular movements intact.     Comments: Conjunctival injection bilaterally  Cardiovascular:     Rate and Rhythm: Normal rate and regular rhythm.     Heart sounds: Normal heart sounds.  Pulmonary:     Effort: Pulmonary effort is normal.     Breath sounds: Normal breath sounds.  Abdominal:     Palpations: Abdomen is soft.     Tenderness: There is abdominal tenderness. There is guarding.      Comments: Ostomy located as above, appears enlarged with brown ostomy effluent, no obvious  melena/hematochezia. Midline abdominal surgical scar, well-healed with some residual scabbing Generalized abdominal tenderness and guarding, hyperactive bowel sounds.  Musculoskeletal:     Cervical back: Normal range of motion.     Comments: Moves all extremities spontaneously without difficulty  Skin:    General: Skin is warm and dry.  Neurological:     Mental Status: He is alert and oriented to person, place, and time.     (all labs ordered are listed, but only abnormal results are displayed) Labs Reviewed  CBC - Abnormal; Notable for the following components:      Result Value   Hemoglobin 12.4 (*)    All other components within normal limits  COMPREHENSIVE METABOLIC PANEL WITH GFR - Abnormal; Notable for the following components:   Total Protein 8.2 (*)    All other components within normal limits  URINALYSIS, ROUTINE W REFLEX MICROSCOPIC - Abnormal; Notable for the following components:   APPearance HAZY (*)    All other components within normal limits  URINE DRUG SCREEN  ETHANOL  LIPASE, BLOOD  TROPONIN T, HIGH SENSITIVITY  TROPONIN T, HIGH SENSITIVITY    EKG: None  Radiology: DG Chest 2 View Result Date: 02/14/2024 EXAM: 2 VIEW(S) XRAY OF THE CHEST 02/14/2024 04:48:00 PM COMPARISON: 12/17/2023 CLINICAL HISTORY: chest pain. Per chart: Pt reports chest pain and severe abdominal pain and swelling within and around stoma site for one week. Recently chest pain has become sharp and burning. Reports that he missed an appointment to reverse his ostomy and feels that he has ; internal damage. Stoma is enlarged and bright red. Patient endorses meth use. FINDINGS: LUNGS AND PLEURA: Elevated left hemidiaphragm. No focal pulmonary opacity. No pulmonary edema. No pleural effusion. No pneumothorax. HEART AND MEDIASTINUM: No acute abnormality of the cardiac and mediastinal silhouettes. BONES AND SOFT TISSUES: No acute osseous abnormality. UPPER ABDOMEN: Marked Gaseous distention of  stomach. IMPRESSION: 1. No acute cardiopulmonary process. 2. Marked gaseous distention of the stomach. 3. Elevated left hemidiaphragm. Electronically signed by: Norman Gatlin MD 02/14/2024 05:14 PM EDT RP Workstation: [id number]     Procedures   Medications Ordered in the ED - No data to display                                  Medical Decision Making This patient presents to the ED for concern of abdominal pain and chest pain, this involves an extensive number of treatment options, and is a complaint that carries with it a high risk of complications and morbidity.  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  The pertinent results include: CBC is notable for hemoglobin of 12.4, this has trended down from most recent baseline of 12.8 from 1 month ago, no leukocytosis.  CMP largely stable from previous. Initial and repeat troponin remain flat, < 15. Urinalysis without leukocytes or nitrites, UDS negative. Lipase within normal limits. Ethanol level undetectable.     Imaging Studies ordered:  I ordered imaging studies including CXR, CT abdomen/pelvis  I independently visualized and interpreted imaging which showed  - CXR: 1. No acute cardiopulmonary process. 2. Marked gaseous distention of the stomach. 3. Elevated left hemidiaphragm. - CT abdomen/pelvis: Considerable retained fecal material within the colon consistent with constipation. This raises suspicion for  decreased output at the stoma. Correlate with clinical history. Some persistent swirling of the mesenteric vasculature is noted although no obstructive changes are seen as were noted on the prior exam. Multiple air and fluid filled loops of small bowel although no discrete obstructive change is noted at this time.  I agree with the radiologist interpretation  Consultations Obtained:  I requested consultation with general surgery,  and discussed lab and imaging findings as well as pertinent plan - they recommend: consult pending at time of shift change   Problem List / ED Course / Critical interventions / Medication management I have reviewed the patients home medicines and have made adjustments as needed   Social Determinants of Health:  Tobacco use, housing instability   Test / Admission - Considered:  Physical exam is notable as above, patient with abdominal tenderness/guarding, recent history of multiple abdominal surgeries as well as ostomy, concern for volvulus versus bowel obstruction as contributing to his symptoms today, will obtain CT imaging to evaluate further.  Labs are largely reassuring as above, patient is afebrile and without tachycardia. CT imaging notable as above. Based on these findings it is unlikely that surgical intervention is warranted, however patient has been lost to follow-up with [facility], recommend that he contact their office to schedule follow-up.I recommend that he start Miralax  for constipation and increase his fluid intake to avoid worsening constipation. He voiced understanding and is in agreement with this plan, return precautions discussed. He is appropriate for discharge at this time. He was given extra ostomy supplies prior to discharge.  Case discussed with Dr. Ruthe  Amount and/or Complexity of Data Reviewed Labs: ordered. Radiology: ordered.  Risk Prescription drug management.        Final diagnoses:  Constipation,  unspecified constipation type    ED Discharge Orders     None          Glendia Rocky LOISE DEVONNA 02/14/24 2223    Ruthe Cornet, DO 02/14/24 2256

## 2024-02-14 NOTE — ED Notes (Signed)
 ED Provider at bedside.

## 2024-02-17 ENCOUNTER — Emergency Department (HOSPITAL_COMMUNITY)
Admission: EM | Admit: 2024-02-17 | Discharge: 2024-02-17 | Disposition: A | Discharge status: Home / Self Care | Attending: Emergency Medicine | Admitting: Emergency Medicine

## 2024-02-17 ENCOUNTER — Telehealth: Payer: Self-pay | Admitting: Gastroenterology

## 2024-02-17 ENCOUNTER — Encounter: Payer: Self-pay | Admitting: Gastroenterology

## 2024-02-17 DIAGNOSIS — K5900 Constipation, unspecified: Secondary | ICD-10-CM | POA: Insufficient documentation

## 2024-02-17 MED ORDER — POLYETHYLENE GLYCOL 3350 17 G PO PACK: 17.0000 g | PACK | Freq: Every day | ORAL | Status: DC

## 2024-02-17 MED ORDER — POLYETHYLENE GLYCOL 3350 17 G PO PACK: 17.0000 g | PACK | Freq: Every day | ORAL | 0 refills | Status: AC

## 2024-02-17 MED ORDER — POLYETHYLENE GLYCOL 3350 17 G PO PACK
17.0000 g | PACK | Freq: Once | ORAL | Status: AC
  Administered 2024-02-17: 17 g via ORAL
  Filled 2024-02-17: qty 1

## 2024-02-17 NOTE — Discharge Instructions (Signed)
 Follow-up with your primary care provider.  Return if you have any concerning symptoms such as worsening abdominal pain, nausea, or throwing up.  Your exam was reassuring.  You were given a dose of MiraLAX  in the emergency department.  I have also prescribed this for you.  You can take up to 2 to 3 packets of MiraLAX  per day until you get the desired effect and then you can take one daily.

## 2024-02-17 NOTE — ED Triage Notes (Signed)
 Patient states he was seen here Monday night, 3 days ago and was suppose to be given a prescription for constipation. This nurse looked at discharge instructions from 10.20.2025 and no prescription given. Was instructed to take Miralax  daily. Informed patient that Miralax  is an over the counter medication that he can get at her pharmacy. Patient verbalized understanding. Patient then states he use to take a prescription for Certavite and would like a prescription for this that it helped. No complaints of pain or issue that needs to be addressed just wants a prescription. Denies pain

## 2024-02-17 NOTE — ED Provider Notes (Signed)
 [place] EMERGENCY DEPARTMENT AT [facility] Provider Note   CSN: [id number] Arrival date & time: 02/17/24  1547     Patient presents with: No chief complaint on file.   Robert Duffy is a 43 y.o. male.   43 year old male presents today for wanting MiraLAX .  He states he was seen 3 days ago and was told to take MiraLAX  but has not.  Denies any pain.  Just states that he would like a prescription for MiraLAX .  He states there was also another medication called Certa-Vite that he was previously prescribed but does not have anymore.  Denies any abdominal pain, nausea, vomiting.  States his output into his ostomy site has decreased some.  The history is provided by the patient. No language interpreter was used.       Prior to Admission medications   Medication Sig Start Date End Date Taking? Authorizing Provider  acetaminophen  (TYLENOL ) 500 MG tablet Take 2 tablets (1,000 mg total) by mouth every 8 (eight) hours as needed. 12/29/23   Maczis, Michael M, PA-C  docusate sodium  (COLACE) 100 MG capsule Take 1 capsule (100 mg total) by mouth 2 (two) times daily. Patient not taking: Reported on 12/24/2023 10/28/23   [name], PA-C  famotidine  (PEPCID ) 20 MG tablet Take 1 tablet (20 mg total) by mouth 2 (two) times daily. Patient not taking: Reported on 12/24/2023 07/18/20   Geiple, Joshua, PA-C  folic acid  (FOLVITE ) 1 MG tablet Take 1 tablet (1 mg total) by mouth daily. 12/30/23   Maczis, Michael M, PA-C  methocarbamol  (ROBAXIN ) 500 MG tablet Take 1 tablet (500 mg total) by mouth every 6 (six) hours as needed (use for muscle cramps/pain). 12/29/23   Maczis, Michael M, PA-C  Multiple Vitamin (MULTIVITAMIN WITH MINERALS) TABS tablet Take 1 tablet by mouth daily. 12/30/23   Maczis, Michael M, PA-C  Ostomy Supplies (OSTOMY BELT LARGE) MISC 1 kit by Does not apply route daily as needed. 11/18/23 11/17/24  [name], FNP  Ostomy Supplies KIT 1 kit by Does not apply route once a week.  11/18/23 02/16/24  [name], FNP  Ostomy Supplies Pouch MISC 1 Application by Does not apply route once a week. 11/18/23 11/17/24  [name], FNP  pantoprazole  (PROTONIX ) 40 MG tablet Take 1 tablet (40 mg total) by mouth daily for 28 days. Patient not taking: Reported on 12/24/2023 12/19/22 12/24/23  [name], PA-C  thiamine  (VITAMIN B-1) 100 MG tablet Take 1 tablet (100 mg total) by mouth daily. 12/30/23   Maczis, Michael M, PA-C    Allergies: Other    Review of Systems  Constitutional:  Negative for chills and fever.  Respiratory:  Negative for shortness of breath.   Gastrointestinal:  Negative for abdominal distention, abdominal pain, nausea and vomiting.  Neurological:  Negative for light-headedness.  All other systems reviewed and are negative.   Updated Vital Signs BP 110/80 (BP Location: Left Arm)   Pulse 92   Temp 99 F (37.2 C) (Oral)   Resp 18   SpO2 99%   Physical Exam Vitals and nursing note reviewed.  Constitutional:      General: He is not in acute distress.    Appearance: Normal appearance. He is not ill-appearing.  HENT:     Head: Normocephalic and atraumatic.     Nose: Nose normal.  Eyes:     Conjunctiva/sclera: Conjunctivae normal.  Cardiovascular:     Rate and Rhythm: Normal rate and regular rhythm.  Pulmonary:  Effort: Pulmonary effort is normal. No respiratory distress.  Abdominal:     General: There is no distension.     Palpations: Abdomen is soft.     Tenderness: There is no abdominal tenderness. There is no guarding.  Musculoskeletal:        General: No deformity. Normal range of motion.     Cervical back: Normal range of motion.  Skin:    Findings: No rash.  Neurological:     Mental Status: He is alert.     (all labs ordered are listed, but only abnormal results are displayed) Labs Reviewed - No data to display  EKG: None  Radiology: No results found.   Procedures   Medications Ordered in the ED  polyethylene  glycol (MIRALAX  / GLYCOLAX ) packet 17 g (has no administration in time range)                                    Medical Decision Making Risk OTC drugs.   43 year old male presents for prescription request for MiraLAX .  Dose of this was given to him in the emergency department.  Refill also prescribed.  Has no complaints today.  Denies abdominal pain, nausea, vomiting.  On exam his abdomen is soft, nondistended and without tenderness.  Ostomy bag noted on the left side of his abdomen.  Visible contents noted within the bag. Supportive care discussed. Discussed return precautions. Close follow-up with his PCP. Patient is stable for discharge. Discharged in stable condition.  Final diagnoses:  Constipation, unspecified constipation type    ED Discharge Orders          Ordered    polyethylene glycol (MIRALAX ) 17 g packet  Daily        02/17/24 1836               [name], PA-C 02/17/24 1838    Neysa Caron PARAS, DO 02/18/24 0012

## 2024-02-17 NOTE — ED Notes (Signed)
Patient provided with bus pass

## 2024-02-18 NOTE — Telephone Encounter (Signed)
  error

## 2024-02-29 ENCOUNTER — Emergency Department (HOSPITAL_COMMUNITY)
Admission: EM | Admit: 2024-02-29 | Discharge: 2024-02-29 | Disposition: A | Discharge status: Home / Self Care | Attending: Emergency Medicine | Admitting: Emergency Medicine

## 2024-02-29 ENCOUNTER — Other Ambulatory Visit: Payer: Self-pay

## 2024-02-29 ENCOUNTER — Encounter (HOSPITAL_COMMUNITY): Payer: Self-pay

## 2024-02-29 DIAGNOSIS — Z5189 Encounter for other specified aftercare: Secondary | ICD-10-CM

## 2024-02-29 DIAGNOSIS — Z4800 Encounter for change or removal of nonsurgical wound dressing: Secondary | ICD-10-CM | POA: Insufficient documentation

## 2024-02-29 NOTE — ED Triage Notes (Signed)
 Pt is coming in for his surgical incision that he wants checked, the incision looks like it healing well, he states it is a tiny hard knot beside the incision site. Pt is otherwise stable with no other complaints at this time.

## 2024-02-29 NOTE — Discharge Instructions (Addendum)
 Follow-up with your general surgeon to be rechecked.  Return to the emergency room if you start experiencing fever vomiting purulent wound drainage or other concerning symptoms

## 2024-02-29 NOTE — ED Provider Notes (Signed)
 [place] EMERGENCY DEPARTMENT AT [facility] Provider Note   CSN: [id number] Arrival date & time: 02/29/24  0413     Patient presents with: Post-op Problem   Robert Duffy is a 43 y.o. male.   HPI   Patient has a history of substance use disorder.  He also has a history of a volvulus requiring colectomy colostomy.  Patient states the other day he noticed some lump adjacent to his incision.  He has not felt any drainage.  No fevers.  No abdominal pain.  Prior to Admission medications   Medication Sig Start Date End Date Taking? Authorizing Provider  acetaminophen  (TYLENOL ) 500 MG tablet Take 2 tablets (1,000 mg total) by mouth every 8 (eight) hours as needed. 12/29/23   Maczis, Michael M, PA-C  docusate sodium  (COLACE) 100 MG capsule Take 1 capsule (100 mg total) by mouth 2 (two) times daily. Patient not taking: Reported on 12/24/2023 10/28/23   [name], PA-C  famotidine  (PEPCID ) 20 MG tablet Take 1 tablet (20 mg total) by mouth 2 (two) times daily. Patient not taking: Reported on 12/24/2023 07/18/20   Geiple, Joshua, PA-C  folic acid  (FOLVITE ) 1 MG tablet Take 1 tablet (1 mg total) by mouth daily. 12/30/23   Maczis, Michael M, PA-C  methocarbamol  (ROBAXIN ) 500 MG tablet Take 1 tablet (500 mg total) by mouth every 6 (six) hours as needed (use for muscle cramps/pain). 12/29/23   Maczis, Michael M, PA-C  Multiple Vitamin (MULTIVITAMIN WITH MINERALS) TABS tablet Take 1 tablet by mouth daily. 12/30/23   Maczis, Michael M, PA-C  Ostomy Supplies (OSTOMY BELT LARGE) MISC 1 kit by Does not apply route daily as needed. 11/18/23 11/17/24  [name], FNP  Ostomy Supplies KIT 1 kit by Does not apply route once a week. 11/18/23 02/16/24  [name], FNP  Ostomy Supplies Pouch MISC 1 Application by Does not apply route once a week. 11/18/23 11/17/24  [name], FNP  pantoprazole  (PROTONIX ) 40 MG tablet Take 1 tablet (40 mg total) by mouth daily for 28 days. Patient not taking:  Reported on 12/24/2023 12/19/22 12/24/23  [name], PA-C  polyethylene glycol (MIRALAX ) 17 g packet Take 17 g by mouth daily. 02/17/24   [name], PA-C  thiamine  (VITAMIN B-1) 100 MG tablet Take 1 tablet (100 mg total) by mouth daily. 12/30/23   Maczis, Michael M, PA-C    Allergies: Other    Review of Systems  Updated Vital Signs BP (!) 140/100   Pulse 98   Temp 98.5 F (36.9 C)   Resp 15   SpO2 100%   Physical Exam Vitals and nursing note reviewed.  Constitutional:      General: He is not in acute distress.    Appearance: He is well-developed.  HENT:     Head: Normocephalic and atraumatic.     Right Ear: External ear normal.     Left Ear: External ear normal.  Eyes:     General: No scleral icterus.       Right eye: No discharge.        Left eye: No discharge.     Conjunctiva/sclera: Conjunctivae normal.  Neck:     Trachea: No tracheal deviation.  Cardiovascular:     Rate and Rhythm: Normal rate.  Pulmonary:     Effort: Pulmonary effort is normal. No respiratory distress.     Breath sounds: No stridor.  Abdominal:     General: There is no distension.     Tenderness:  There is no abdominal tenderness. There is no guarding.     Comments: Colostomy bag in place, notable incisional wound without any signs of erythema or drainage, some slight induration at the inferior aspect of the wound site, no erythema no fluctuance, no tenderness  Musculoskeletal:        General: No swelling or deformity.     Cervical back: Neck supple.  Skin:    General: Skin is warm and dry.     Findings: No rash.  Neurological:     Mental Status: He is alert. Mental status is at baseline.     Cranial Nerves: No dysarthria or facial asymmetry.     Motor: No seizure activity.     (all labs ordered are listed, but only abnormal results are displayed) Labs Reviewed - No data to display  EKG: None  Radiology: No results found.   Procedures   Medications Ordered in the ED - No data  to display                                  Medical Decision Making  Wound does not appear to show any signs of acute infection.  Exam is not suggestive of a hernia.  Most consistent with scarring around the incision site.  Patient is not having any other symptoms.  I do not feel that he requires laboratory testing or advanced imaging.  I do recommend that he follow-up with his general surgeon.  Patient also requested a refill of his colostomy supplies.  I also recommended that he speak to his surgeons or primary care doctor about that because he should be able to get prescriptions and medical supplies regularly filled     Final diagnoses:  Encounter for wound re-check    ED Discharge Orders     None          Randol Simmonds, MD 02/29/24 [phone]

## 2024-03-03 ENCOUNTER — Encounter: Payer: Self-pay | Admitting: Gastroenterology

## 2024-03-19 ENCOUNTER — Encounter (HOSPITAL_COMMUNITY): Payer: Self-pay

## 2024-03-19 ENCOUNTER — Other Ambulatory Visit: Payer: Self-pay

## 2024-03-19 ENCOUNTER — Emergency Department (HOSPITAL_COMMUNITY)
Admission: EM | Admit: 2024-03-19 | Discharge: 2024-03-19 | Disposition: A | Discharge status: Home / Self Care | Attending: Emergency Medicine | Admitting: Emergency Medicine

## 2024-03-19 DIAGNOSIS — Z433 Encounter for attention to colostomy: Secondary | ICD-10-CM | POA: Insufficient documentation

## 2024-03-19 DIAGNOSIS — Z760 Encounter for issue of repeat prescription: Secondary | ICD-10-CM | POA: Insufficient documentation

## 2024-03-19 DIAGNOSIS — Z7189 Other specified counseling: Secondary | ICD-10-CM

## 2024-03-19 NOTE — ED Triage Notes (Signed)
 Pt arrives via POV. Pt reports he needs supplies for his colostomy. Denies any other complaints. Pt AxOx4.

## 2024-03-19 NOTE — ED Provider Notes (Signed)
 [place] EMERGENCY DEPARTMENT AT [facility] Provider Note   CSN: [id number] Arrival date & time: 03/19/24  1734     Patient presents with: Needs colostomy supplies   Robert Duffy is a 43 y.o. male here for evaluation of questing ostomy supplies.  He had a volvulus and subsequently needed a colectomy 11/2023.  He states he has difficulty obtaining ostomy supplies.  He denies any changes surrounding his ostomy or any changes in his stool.  No fever, abdominal pain, bloody stool, diarrhea, constipation.   HPI     Prior to Admission medications   Medication Sig Start Date End Date Taking? Authorizing Provider  acetaminophen  (TYLENOL ) 500 MG tablet Take 2 tablets (1,000 mg total) by mouth every 8 (eight) hours as needed. 12/29/23   Maczis, Michael M, PA-C  docusate sodium  (COLACE) 100 MG capsule Take 1 capsule (100 mg total) by mouth 2 (two) times daily. Patient not taking: Reported on 12/24/2023 10/28/23   [name], PA-C  famotidine  (PEPCID ) 20 MG tablet Take 1 tablet (20 mg total) by mouth 2 (two) times daily. Patient not taking: Reported on 12/24/2023 07/18/20   Geiple, Joshua, PA-C  folic acid  (FOLVITE ) 1 MG tablet Take 1 tablet (1 mg total) by mouth daily. 12/30/23   Maczis, Michael M, PA-C  methocarbamol  (ROBAXIN ) 500 MG tablet Take 1 tablet (500 mg total) by mouth every 6 (six) hours as needed (use for muscle cramps/pain). 12/29/23   Maczis, Michael M, PA-C  Multiple Vitamin (MULTIVITAMIN WITH MINERALS) TABS tablet Take 1 tablet by mouth daily. 12/30/23   Maczis, Michael M, PA-C  Ostomy Supplies (OSTOMY BELT LARGE) MISC 1 kit by Does not apply route daily as needed. 11/18/23 11/17/24  [name], FNP  Ostomy Supplies KIT 1 kit by Does not apply route once a week. 11/18/23 02/16/24  [name], FNP  Ostomy Supplies Pouch MISC 1 Application by Does not apply route once a week. 11/18/23 11/17/24  [name], FNP  pantoprazole  (PROTONIX ) 40 MG tablet Take 1 tablet (40 mg  total) by mouth daily for 28 days. Patient not taking: Reported on 12/24/2023 12/19/22 12/24/23  [name], PA-C  polyethylene glycol (MIRALAX ) 17 g packet Take 17 g by mouth daily. 02/17/24   Hildegard, Amjad, PA-C  thiamine  (VITAMIN B-1) 100 MG tablet Take 1 tablet (100 mg total) by mouth daily. 12/30/23   Maczis, Michael M, PA-C    Allergies: Other    Review of Systems  Constitutional: Negative.   HENT: Negative.    Respiratory: Negative.    Cardiovascular: Negative.   Gastrointestinal: Negative.   Genitourinary: Negative.   Musculoskeletal: Negative.   Skin: Negative.   All other systems reviewed and are negative.   Updated Vital Signs BP 121/80 (BP Location: Left Arm)   Pulse 98   Temp 97.8 F (36.6 C) (Oral)   Resp 15   SpO2 100%   Physical Exam Vitals and nursing note reviewed.  Constitutional:      General: He is not in acute distress.    Appearance: He is well-developed. He is not ill-appearing or diaphoretic.  HENT:     Head: Atraumatic.  Eyes:     Pupils: Pupils are equal, round, and reactive to light.  Cardiovascular:     Rate and Rhythm: Normal rate and regular rhythm.     Pulses: Normal pulses.     Heart sounds: Normal heart sounds.  Pulmonary:     Effort: Pulmonary effort is normal. No respiratory distress.  Breath sounds: Normal breath sounds.  Abdominal:     General: There is no distension.     Palpations: Abdomen is soft.     Comments: Ostomy C/D/I wo drainage from site  Musculoskeletal:        General: Normal range of motion.     Cervical back: Normal range of motion and neck supple.  Skin:    General: Skin is warm and dry.  Neurological:     General: No focal deficit present.     Mental Status: He is alert and oriented to person, place, and time.     (all labs ordered are listed, but only abnormal results are displayed) Labs Reviewed - No data to display  EKG: None  Radiology: No results found.   Procedures   Medications  Ordered in the ED - No data to display  42 here for evaluation of requesting ostomy supplies.  Ostomy for volvulus 11/2023.  He denies any changes in bowel movements or skin changes surrounding the ostomy site.  No systemic symptoms.  Patient was given multiple ostomy supplies here in the ED.  TOC contacted as patient states he does not have access to ostomy supplies outpatient  The patient has been appropriately medically screened and/or stabilized in the ED. I have low suspicion for any other emergent medical condition which would require further screening, evaluation or treatment in the ED or require inpatient management.  Patient is hemodynamically stable and in no acute distress.  Patient able to ambulate in department prior to ED.  Evaluation does not show acute pathology that would require ongoing or additional emergent interventions while in the emergency department or further inpatient treatment.  I have discussed the diagnosis with the patient and answered all questions.  Pain is been managed while in the emergency department and patient has no further complaints prior to discharge.  Patient is comfortable with plan discussed in room and is stable for discharge at this time.  I have discussed strict return precautions for returning to the emergency department.  Patient was encouraged to follow-up with PCP/specialist refer to at discharge.                                    Medical Decision Making Amount and/or Complexity of Data Reviewed External Data Reviewed: labs and notes.  Risk OTC drugs. Diagnosis or treatment significantly limited by social determinants of health.         Final diagnoses:  Medication refill  Encounter for ostomy care education    ED Discharge Orders     None          River Mckercher A, PA-C 03/19/24 2211    Patt Alm Macho, MD 03/19/24 2239

## 2024-03-19 NOTE — Discharge Instructions (Signed)
 I placed a consult to our social worker to help you get established for ostomy supplies at your house  Make sure to follow-up outpatient, return for new or worsening symptoms

## 2024-04-06 ENCOUNTER — Encounter: Payer: Self-pay | Admitting: Gastroenterology

## 2024-04-06 ENCOUNTER — Ambulatory Visit (INDEPENDENT_AMBULATORY_CARE_PROVIDER_SITE_OTHER): Admitting: Gastroenterology

## 2024-04-06 VITALS — BP 126/78 | HR 85 | Ht 71.0 in | Wt 178.1 lb

## 2024-04-06 DIAGNOSIS — K562 Volvulus: Secondary | ICD-10-CM

## 2024-04-06 DIAGNOSIS — Z9049 Acquired absence of other specified parts of digestive tract: Secondary | ICD-10-CM | POA: Diagnosis not present

## 2024-04-06 DIAGNOSIS — Z933 Colostomy status: Secondary | ICD-10-CM | POA: Diagnosis not present

## 2024-04-06 NOTE — Progress Notes (Signed)
 [facility] Consult Note:  History: Robert Duffy 04/06/2024  Referring provider: Patient, No Pcp Per  Reason for consult/chief complaint: preparation for colostomy reversal (Pt states he feels fine just here for removal )   Subjective  Prior history:  From 12/20/2023 surgical office note by Dr. Leonor Dawn: Robert Duffy is a 43 y.o. male who underwent sigmoid colectomy with end colostomy on 10/23/23 for acute sigmoid volvulus. He has recovered well from surgery. Ideally would like to reverse his colostomy, especially as it has been difficult to manage in his current social situation. Would prefer for him to have a screening colonoscopy first, although it may be difficult to complete the prep for this in his current living situation. Will refer to GI to discuss this, and he was able to provide a phone number for a friend to use to set up the referral. I discussed that ideally we would prefer that he stop smoking or using any drugs prior to surgery, in order to decrease his risk of anastomotic leak. I also discussed his case with one of our colorectal surgeons, and have set him up to see colorectal surgery in a few months. In the meantime will refer him to GI.   Admitted to the hospital just after that for an acute small bowel volvulus with discharge summary including the following: This is a 43 yo black male with a history of polysubstance abuse (meth, cocaine, at least 4 16 oz beers/day, and tobacco abuse 1 pack per week) who recently underwent a laparotomy for sigmoid colectomy/colostomy due to a sigmoid volvulus.  He has been doing well and just saw Dr. Dawn in the office 3 days ago with no issues.  He states he woke up early this morning with some abdominal pain.  He admits to some dry heaving, but no vomiting.  He is not a great historian so difficult to get much more pertinent information.  His colostomy seems to have slowed overnight.  He is unsure if he has  bloating.  He admits to chest pain, but states this has been present for 3 years since an accident.  He is also unaware of when the rash/redness on his chest wall started.  He lives in an acquaintance's building.  He is otherwise homeless.  He is estranged from his siblings and his parents are deceased.     He presented to the ED due to pain in his abdomen.  He has normal labs, but a CT scan concerning for SB volvulus with significant proximal SB and gastric distention.  We have been asked to see him for further recommendations.    Procedures Dr. Curvin - 12/24/23 Exploratory laparotomy with reduction of small bowel volvulus   Hospital Course:  Patient presented as above and was taken to the OR where he underwent Exploratory laparotomy with reduction of small bowel volvulus. Patient tolerated the procedure well and was transferred to the floor post op. Patient remained with NGT post op. On POD 2 NGT was removed and diet was advanced. Diet was advanced and tolerated. On POD 5, the patient was voiding well, tolerating diet, ambulating well, pain well controlled, vital signs stable, incisions c/d/i and felt stable for discharge home.    History of Present Illness   This is a 43 year old man referred by general surgery for consideration for presurgical colonoscopy.Robert Duffy was uncertain why he was here today, but believes it had something to do with getting an eventual colostomy takedown. He is having regular bowel movements  through the colostomy, has not seen any change in that and no bleeding.  He had a recent trip to the emergency department simply because he ran out of ostomy supplies and did not have any way to get them.  He denies dysphagia odynophagia vomiting or weight loss.  Robert Duffy has a challenging social situation where he is currently living in a shed on someone's property where there is no electricity or running water.  He needs to walk down the street to any open a restaurant in order to  use the bathroom, though says oftentimes they will not let him do so.  He has a mobile phone with him that he was charging today, but says there is no service on it at present so he cannot be reached by phone or email.  He has no one who could bring him for colonoscopy.  He is also still using drugs, including methamphetamine and crack cocaine as recently as several days ago.  He also walks several miles to come to this visit today (which at least indicates that he is engaged in his own care).   ROS:  Review of Systems He denies chest pain dyspnea or dysuria Remainder of systems are negative as near as can be determined.  (Limited health literacy, challenging historian)  Past Medical History: Past Medical History:  Diagnosis Date   Polysubstance abuse (HCC)      Past Surgical History: Past Surgical History:  Procedure Laterality Date   COLON RESECTION SIGMOID N/A 10/23/2023   Procedure: COLECTOMY, SIGMOID, OPEN;  Surgeon: Dasie Leonor CROME, MD;  Location: [facility];  Service: General;  Laterality: N/A;   COLOSTOMY N/A 10/23/2023   Procedure: CREATION, COLOSTOMY;  Surgeon: Dasie Leonor CROME, MD;  Location: [facility];  Service: General;  Laterality: N/A;   LAPAROSCOPY N/A 10/23/2023   Procedure: LAPAROSCOPY, DIAGNOSTIC;  Surgeon: Dasie Leonor CROME, MD;  Location: [facility];  Service: General;  Laterality: N/A;   LAPAROTOMY N/A 12/24/2023   Procedure: EXPLORATORY LAPAROTOMY WITH REDUCTION OF SMALL BOWEL VOLVULUS;  Surgeon: Curvin Deward MOULD, MD;  Location: [facility];  Service: General;  Laterality: N/A;  Possible bowel resection.     Family History: History reviewed. No pertinent family history. No known family history of colorectal cancer Social History: Social History   Socioeconomic History   Marital status: Single    Spouse name: Not on file   Number of children: Not on file   Years of education: Not on file   Highest education level: Not on file  Occupational History   Not on file  Tobacco Use    Smoking status: Light Smoker    Types: Cigarettes   Smokeless tobacco: Never  Vaping Use   Vaping status: Never Used  Substance and Sexual Activity   Alcohol use: Yes    Alcohol/week: 28.0 standard drinks of alcohol    Types: 28 Cans of beer per week   Drug use: Yes    Frequency: 5.0 times per week    Types: Marijuana, Methamphetamines, Cocaine   Sexual activity: Not on file  Other Topics Concern   Not on file  Social History Narrative   The patient is homeless.  He is estranged from his siblings.  His parents are both deceased.  He has no next of kin essentially as he has no contact information for his siblings.  He states his youngest sibling's name is Brittany Bozzo and lives in [place]., [place], but may also go to [place] at times as well.  Social Drivers of Health   Tobacco Use: High Risk (03/19/2024)   Patient History    Smoking Tobacco Use: Light Smoker    Smokeless Tobacco Use: Never    Passive Exposure: Not on file  Financial Resource Strain: Not on file  Food Insecurity: No Food Insecurity (12/24/2023)   Epic    Worried About Programme Researcher, Broadcasting/film/video in the Last Year: Never true    Ran Out of Food in the Last Year: Never true  Transportation Needs: Unmet Transportation Needs (12/24/2023)   Epic    Lack of Transportation (Medical): No    Lack of Transportation (Non-Medical): Yes  Physical Activity: Not on file  Stress: Not on file  Social Connections: Not on file  Depression (EYV7-0): Not on file  Alcohol Screen: Not on file  Housing: High Risk (12/24/2023)   Epic    Unable to Pay for Housing in the Last Year: No    Number of Times Moved in the Last Year: 4    Homeless in the Last Year: Yes  Utilities: Not At Risk (12/24/2023)   Epic    Threatened with loss of utilities: No  Health Literacy: Not on file    Allergies: Allergies[1]  Outpatient Meds: Current Outpatient Medications  Medication Sig Dispense Refill   acetaminophen  (TYLENOL ) 500 MG tablet Take 2  tablets (1,000 mg total) by mouth every 8 (eight) hours as needed. 40 tablet 0   methocarbamol  (ROBAXIN ) 500 MG tablet Take 1 tablet (500 mg total) by mouth every 6 (six) hours as needed (use for muscle cramps/pain). 30 tablet 0   docusate sodium  (COLACE) 100 MG capsule Take 1 capsule (100 mg total) by mouth 2 (two) times daily. (Patient not taking: Reported on 04/06/2024) 10 capsule 0   famotidine  (PEPCID ) 20 MG tablet Take 1 tablet (20 mg total) by mouth 2 (two) times daily. (Patient not taking: Reported on 04/06/2024) 30 tablet 0   folic acid  (FOLVITE ) 1 MG tablet Take 1 tablet (1 mg total) by mouth daily. (Patient not taking: Reported on 04/06/2024) 30 tablet 0   Multiple Vitamin (MULTIVITAMIN WITH MINERALS) TABS tablet Take 1 tablet by mouth daily. (Patient not taking: Reported on 04/06/2024) 30 tablet 0   Ostomy Supplies (OSTOMY BELT LARGE) MISC 1 kit by Does not apply route daily as needed. (Patient not taking: Reported on 04/06/2024) 1 each 1   Ostomy Supplies KIT 1 kit by Does not apply route once a week. (Patient not taking: Reported on 04/06/2024) 10 kit 5   Ostomy Supplies Pouch MISC 1 Application by Does not apply route once a week. (Patient not taking: Reported on 04/06/2024) 10 Units 5   pantoprazole  (PROTONIX ) 40 MG tablet Take 1 tablet (40 mg total) by mouth daily for 28 days. (Patient not taking: Reported on 04/06/2024) 28 tablet 0   polyethylene glycol (MIRALAX ) 17 g packet Take 17 g by mouth daily. (Patient not taking: Reported on 04/06/2024) 28 each 0   thiamine  (VITAMIN B-1) 100 MG tablet Take 1 tablet (100 mg total) by mouth daily. (Patient not taking: Reported on 04/06/2024) 30 tablet 0   No current facility-administered medications for this visit.      ___________________________________________________________________ Objective   Exam:  BP 126/78   Pulse 85   Ht 5' 11 (1.803 m)   Wt 178 lb 2 oz (80.8 kg)   BMI 24.84 kg/m  Wt Readings from Last 3 Encounters:   04/06/24 178 lb 2 oz (80.8 kg)  01/31/24 165 lb  5.5 oz (75 kg)  12/24/23 166 lb (75.3 kg)    General: Pleasant man who is not acutely ill-appearing.  Fair eye contact, somewhat restricted affect Eyes: sclera anicteric, no redness ENT: oral mucosa moist without lesions, no cervical or supraclavicular lymphadenopathy CV: Regular without appreciable murmur, no JVD, no peripheral edema Resp: clear to auscultation bilaterally, normal RR and effort noted GI: soft, no tenderness, with active bowel sounds. No guarding or palpable organomegaly noted.  Left-sided ostomy with normal stool in the bag.  No apparent parastomal hernia Skin; warm and dry, no rash or jaundice noted Neuro: awake, alert and oriented x 3. Normal gross motor function and fluent speech      Latest Ref Rng & Units 02/14/2024    3:30 PM 12/28/2023    4:30 AM 12/25/2023    5:47 AM  CBC  WBC 4.0 - 10.5 K/uL 7.9  6.0  13.5   Hemoglobin 13.0 - 17.0 g/dL 87.5  87.1  85.4   Hematocrit 39.0 - 52.0 % 39.7  40.2  45.2   Platelets 150 - 400 K/uL 400  371  327       Latest Ref Rng & Units 02/14/2024    3:31 PM 12/28/2023    4:30 AM 12/25/2023    5:47 AM  CMP  Glucose 70 - 99 mg/dL 82  894  888   BUN 6 - 20 mg/dL 14  8  6    Creatinine 0.61 - 1.24 mg/dL 9.24  9.40  9.34   Sodium 135 - 145 mmol/L 138  136  134   Potassium 3.5 - 5.1 mmol/L 4.2  4.2  4.4   Chloride 98 - 111 mmol/L 101  99  98   CO2 22 - 32 mmol/L 26  24  23    Calcium  8.9 - 10.3 mg/dL 9.6  9.6  9.6   Total Protein 6.5 - 8.1 g/dL 8.2     Total Bilirubin 0.0 - 1.2 mg/dL 0.2     Alkaline Phos 38 - 126 U/L 86     AST 15 - 41 U/L 31     ALT 0 - 44 U/L 23      Operative reports reviewed and on file  CT scan reports reviewed and on file   Encounter Diagnoses  Name Primary?   S/P left colectomy Yes   S/P colostomy (HCC)    Sigmoid volvulus (HCC)     Assessment & Plan  Though he is not yet a screening age, I agree with general surgery that this patient would  benefit from a preoperative colonoscopy prior to consideration of colostomy takedown.  Unfortunately, given the above-noted factors of this patient's social situation, there is no feasibility of doing this in the outpatient setting.  He has nowhere to do a bowel preparation, no available care partner, and he is still engaging in polysubstance abuse.  My recommendation is that his surgical team contact him as best they can and try to plan a date for a colostomy reversal, and then admit him to the hospital 2 days prior for consultation with GI, bowel preparation and then colonoscopy the day before planned colostomy takedown.  We gave him [facility] address and phone number in hopes that he may be able to contact them if possible. I am afraid that is the best we can do at this juncture given the social challenges facing him.    Thank you for the courtesy of this consult.  Please call me with any  questions or concerns.  Victory LITTIE Brand III  CC: Referring provider noted above     [1]  Allergies Allergen Reactions   Other Shortness Of Breath, Itching and Other (See Comments)    Cannot tolerate strong smells- Car exhaust, Clorox, etc... Itchy eyes, other allergic symptoms, etc..

## 2024-04-10 ENCOUNTER — Other Ambulatory Visit: Payer: Self-pay

## 2024-04-10 ENCOUNTER — Emergency Department (HOSPITAL_COMMUNITY): Admission: EM | Admit: 2024-04-10 | Discharge: 2024-04-11 | Disposition: A | Discharge status: Home / Self Care

## 2024-04-10 ENCOUNTER — Encounter (HOSPITAL_COMMUNITY): Payer: Self-pay

## 2024-04-10 DIAGNOSIS — R059 Cough, unspecified: Secondary | ICD-10-CM | POA: Diagnosis not present

## 2024-04-10 DIAGNOSIS — R42 Dizziness and giddiness: Secondary | ICD-10-CM | POA: Diagnosis present

## 2024-04-10 LAB — CBG MONITORING, ED: Glucose-Capillary: 103 mg/dL — ABNORMAL HIGH (ref 70–99)

## 2024-04-10 NOTE — ED Triage Notes (Signed)
 Pt BIB EMS from home with reports of dizziness x 20 mins. Pt has had a headache, cbg 324.   18 g left forearm 400 ml ns

## 2024-04-11 LAB — COMPREHENSIVE METABOLIC PANEL WITH GFR
ALT: 60 U/L — ABNORMAL HIGH (ref 0–44)
AST: 57 U/L — ABNORMAL HIGH (ref 15–41)
Albumin: 4.3 g/dL (ref 3.5–5.0)
Alkaline Phosphatase: 85 U/L (ref 38–126)
Anion gap: 8 (ref 5–15)
BUN: 15 mg/dL (ref 6–20)
CO2: 28 mmol/L (ref 22–32)
Calcium: 9.3 mg/dL (ref 8.9–10.3)
Chloride: 100 mmol/L (ref 98–111)
Creatinine, Ser: 0.72 mg/dL (ref 0.61–1.24)
GFR, Estimated: >60 mL/min (ref >60)
Glucose, Bld: 96 mg/dL (ref 70–99)
Potassium: 4.7 mmol/L (ref 3.5–5.1)
Sodium: 136 mmol/L (ref 135–145)
Total Bilirubin: 0.4 mg/dL (ref 0.0–1.2)
Total Protein: 8.2 g/dL — ABNORMAL HIGH (ref 6.5–8.1)

## 2024-04-11 LAB — URINALYSIS, ROUTINE W REFLEX MICROSCOPIC
Bacteria, UA: NONE SEEN
Bilirubin Urine: NEGATIVE
Glucose, UA: NEGATIVE mg/dL
Hgb urine dipstick: NEGATIVE
Ketones, ur: NEGATIVE mg/dL
Leukocytes,Ua: NEGATIVE
Nitrite: NEGATIVE
Protein, ur: NEGATIVE mg/dL
Specific Gravity, Urine: 1.023 (ref 1.005–1.030)
pH: 7 (ref 5.0–8.0)

## 2024-04-11 LAB — CBC
HCT: 39.8 % (ref 39.0–52.0)
Hemoglobin: 12.9 g/dL — ABNORMAL LOW (ref 13.0–17.0)
MCH: 28.5 pg (ref 26.0–34.0)
MCHC: 32.4 g/dL (ref 30.0–36.0)
MCV: 87.9 fL (ref 80.0–100.0)
Platelets: 376 K/uL (ref 150–400)
RBC: 4.53 MIL/uL (ref 4.22–5.81)
RDW: 12.7 % (ref 11.5–15.5)
WBC: 6.9 K/uL (ref 4.0–10.5)
nRBC: 0 % (ref 0.0–0.2)

## 2024-04-11 NOTE — Discharge Instructions (Addendum)
 Seen your medications as prescribed.  Follow-up with your specialist.  Call the office in the morning to discuss your surgery with them.  Avoid poorly ventilated places.  Drink lots of fluids and eat a balanced diet and return to the ER for new or worsening symptoms.

## 2024-04-11 NOTE — ED Provider Notes (Signed)
 [place] EMERGENCY DEPARTMENT AT [facility] Provider Note   CSN: [id number] Arrival date & time: 04/10/24  2311     Patient presents with: Dizziness   Robert Duffy is a 43 y.o. male.   43 year old male presents for evaluation of dizziness.  States he was working in a poorly ventilated area with a car running inside his shed.  States he went outside and started feeling much better.  He states he had some coughing as well.  States this has happened before.  His symptoms have resolved at this time.  Denies any other concerns at this time.   Dizziness Associated symptoms: no chest pain, no palpitations, no shortness of breath and no vomiting        Prior to Admission medications  Medication Sig Start Date End Date Taking? Authorizing Provider  acetaminophen  (TYLENOL ) 500 MG tablet Take 2 tablets (1,000 mg total) by mouth every 8 (eight) hours as needed. 12/29/23   Maczis, Michael M, PA-C  docusate sodium  (COLACE) 100 MG capsule Take 1 capsule (100 mg total) by mouth 2 (two) times daily. Patient not taking: Reported on 04/06/2024 10/28/23   [name], PA-C  famotidine  (PEPCID ) 20 MG tablet Take 1 tablet (20 mg total) by mouth 2 (two) times daily. Patient not taking: Reported on 04/06/2024 07/18/20   Geiple, Joshua, PA-C  folic acid  (FOLVITE ) 1 MG tablet Take 1 tablet (1 mg total) by mouth daily. Patient not taking: Reported on 04/06/2024 12/30/23   Maczis, Michael M, PA-C  methocarbamol  (ROBAXIN ) 500 MG tablet Take 1 tablet (500 mg total) by mouth every 6 (six) hours as needed (use for muscle cramps/pain). 12/29/23   Maczis, Michael M, PA-C  Multiple Vitamin (MULTIVITAMIN WITH MINERALS) TABS tablet Take 1 tablet by mouth daily. Patient not taking: Reported on 04/06/2024 12/30/23   Maczis, Michael M, PA-C  Ostomy Supplies (OSTOMY BELT LARGE) MISC 1 kit by Does not apply route daily as needed. Patient not taking: Reported on 04/06/2024 11/18/23 11/17/24  [name], FNP   Ostomy Supplies KIT 1 kit by Does not apply route once a week. Patient not taking: Reported on 04/06/2024 11/18/23 02/16/24  [name], FNP  Ostomy Supplies Pouch MISC 1 Application by Does not apply route once a week. Patient not taking: Reported on 04/06/2024 11/18/23 11/17/24  [name], FNP  pantoprazole  (PROTONIX ) 40 MG tablet Take 1 tablet (40 mg total) by mouth daily for 28 days. Patient not taking: Reported on 04/06/2024 12/19/22 12/24/23  [name], PA-C  polyethylene glycol (MIRALAX ) 17 g packet Take 17 g by mouth daily. Patient not taking: Reported on 04/06/2024 02/17/24   [name], PA-C  thiamine  (VITAMIN B-1) 100 MG tablet Take 1 tablet (100 mg total) by mouth daily. Patient not taking: Reported on 04/06/2024 12/30/23   Maczis, Michael M, PA-C    Allergies: Other    Review of Systems  Constitutional:  Negative for chills and fever.  HENT:  Negative for ear pain and sore throat.   Eyes:  Negative for pain and visual disturbance.  Respiratory:  Positive for cough. Negative for shortness of breath.   Cardiovascular:  Negative for chest pain and palpitations.  Gastrointestinal:  Negative for abdominal pain and vomiting.  Genitourinary:  Negative for dysuria and hematuria.  Musculoskeletal:  Negative for arthralgias and back pain.  Skin:  Negative for color change and rash.  Neurological:  Positive for dizziness. Negative for seizures and syncope.  All other systems reviewed and are negative.  Updated Vital Signs BP (!) 142/84   Pulse 78   Temp 98.2 F (36.8 C) (Oral)   Resp 18   SpO2 99%   Physical Exam Vitals and nursing note reviewed.  Constitutional:      General: He is not in acute distress.    Appearance: Normal appearance. He is well-developed. He is not ill-appearing.  HENT:     Head: Normocephalic and atraumatic.  Eyes:     Conjunctiva/sclera: Conjunctivae normal.  Cardiovascular:     Rate and Rhythm: Normal rate and regular rhythm.      Pulses: Normal pulses.     Heart sounds: Normal heart sounds. No murmur heard. Pulmonary:     Effort: Pulmonary effort is normal. No respiratory distress.     Breath sounds: Normal breath sounds. No stridor. No wheezing or rhonchi.  Abdominal:     Palpations: Abdomen is soft.     Tenderness: There is no abdominal tenderness.  Musculoskeletal:        General: No swelling.     Cervical back: Neck supple.  Skin:    General: Skin is warm and dry.     Capillary Refill: Capillary refill takes less than 2 seconds.  Neurological:     General: No focal deficit present.     Mental Status: He is alert.  Psychiatric:        Mood and Affect: Mood normal.     (all labs ordered are listed, but only abnormal results are displayed) Labs Reviewed  COMPREHENSIVE METABOLIC PANEL WITH GFR - Abnormal; Notable for the following components:      Result Value   Total Protein 8.2 (*)    AST 57 (*)    ALT 60 (*)    All other components within normal limits  CBC - Abnormal; Notable for the following components:   Hemoglobin 12.9 (*)    All other components within normal limits  CBG MONITORING, ED - Abnormal; Notable for the following components:   Glucose-Capillary 103 (*)    All other components within normal limits  URINALYSIS, ROUTINE W REFLEX MICROSCOPIC    EKG: EKG Interpretation Date/Time:  Monday April 10 2024 23:36:56 EST Ventricular Rate:  77 PR Interval:  118 QRS Duration:  90 QT Interval:  398 QTC Calculation: 451 R Axis:   43  Text Interpretation: Sinus rhythm Borderline short PR interval Early repolarization Compared with prior EKG from 02/14/2024 Confirmed by Gennaro Bouchard ([id number]) on 04/11/2024 2:47:16 AM  Radiology: No results found.   Procedures   Medications Ordered in the ED - No data to display                                  Medical Decision Making Patient here for an episode of dizziness that has resolved.  Vitals are stable, he is able to ambulate  without difficulty.  Lab work and workup is negative.  Advised to follow-up with primary care, seen his medications as prescribed eat a balanced diet and drink plenty of fluids.  Advised to return to the ER for new or worsening symptoms.  He feels comfortably discharged home.  Problems Addressed: Dizziness: acute illness or injury  Amount and/or Complexity of Data Reviewed External Data Reviewed: notes.    Details: Prior ED records reviewed and patient seen 03-19-2024 for medication refill Labs: ordered. Decision-making details documented in ED Course.    Details: Ordered and reviewed by me and unremarkable.  ECG/medicine tests: ordered and independent interpretation performed. Decision-making details documented in ED Course.    Details: Ordered and interpreted by me in the absence of cardiology and shows sinus rhythm, no STEMI, or significant change when compared to prior EKG  Risk OTC drugs. Prescription drug management.     Final diagnoses:  Dizziness    ED Discharge Orders     None          Gennaro Duwaine CROME, DO 04/11/24 [id number]

## 2024-04-15 ENCOUNTER — Emergency Department (HOSPITAL_COMMUNITY)
Admission: EM | Admit: 2024-04-15 | Discharge: 2024-04-15 | Discharge status: Absent without leave | Source: Home / Self Care

## 2024-04-21 ENCOUNTER — Emergency Department (HOSPITAL_COMMUNITY)

## 2024-04-21 ENCOUNTER — Encounter (HOSPITAL_COMMUNITY): Payer: Self-pay

## 2024-04-21 ENCOUNTER — Other Ambulatory Visit: Payer: Self-pay

## 2024-04-21 ENCOUNTER — Emergency Department (HOSPITAL_COMMUNITY)
Admission: EM | Admit: 2024-04-21 | Discharge: 2024-04-21 | Disposition: A | Discharge status: Home / Self Care | Attending: Emergency Medicine | Admitting: Emergency Medicine

## 2024-04-21 DIAGNOSIS — R079 Chest pain, unspecified: Secondary | ICD-10-CM

## 2024-04-21 DIAGNOSIS — R0789 Other chest pain: Secondary | ICD-10-CM | POA: Diagnosis present

## 2024-04-21 DIAGNOSIS — Z432 Encounter for attention to ileostomy: Secondary | ICD-10-CM | POA: Insufficient documentation

## 2024-04-21 MED ORDER — ALUM & MAG HYDROXIDE-SIMETH 200-200-20 MG/5ML PO SUSP
30.0000 mL | Freq: Once | ORAL | Status: AC
  Administered 2024-04-21: 30 mL via ORAL
  Filled 2024-04-21: qty 30

## 2024-04-21 NOTE — ED Provider Notes (Signed)
 " [place] EMERGENCY DEPARTMENT AT [facility] Provider Note   CSN: [id number] Arrival date & time: 04/21/24  1453     Patient presents with: Ostomy Bag Replacement   Robert Duffy is a 43 y.o. male whose primary reason for presenting today is for ostomy bag replacement and ostomy supplies.  Has no active complaints and is asymptomatic, states that he has some leaking around the ostomy bag but no supplies at home hence his presentation today for ostomy bag change as well as requesting supplies for home care.   HPI     Prior to Admission medications  Medication Sig Start Date End Date Taking? Authorizing Provider  acetaminophen  (TYLENOL ) 500 MG tablet Take 2 tablets (1,000 mg total) by mouth every 8 (eight) hours as needed. 12/29/23   Maczis, Michael M, PA-C  docusate sodium  (COLACE) 100 MG capsule Take 1 capsule (100 mg total) by mouth 2 (two) times daily. Patient not taking: Reported on 04/06/2024 10/28/23   [name], PA-C  famotidine  (PEPCID ) 20 MG tablet Take 1 tablet (20 mg total) by mouth 2 (two) times daily. Patient not taking: Reported on 04/06/2024 07/18/20   Geiple, Joshua, PA-C  folic acid  (FOLVITE ) 1 MG tablet Take 1 tablet (1 mg total) by mouth daily. Patient not taking: Reported on 04/06/2024 12/30/23   Maczis, Michael M, PA-C  methocarbamol  (ROBAXIN ) 500 MG tablet Take 1 tablet (500 mg total) by mouth every 6 (six) hours as needed (use for muscle cramps/pain). 12/29/23   Maczis, Michael M, PA-C  Multiple Vitamin (MULTIVITAMIN WITH MINERALS) TABS tablet Take 1 tablet by mouth daily. Patient not taking: Reported on 04/06/2024 12/30/23   Maczis, Michael M, PA-C  Ostomy Supplies (OSTOMY BELT LARGE) MISC 1 kit by Does not apply route daily as needed. Patient not taking: Reported on 04/06/2024 11/18/23 11/17/24  [name], FNP  Ostomy Supplies KIT 1 kit by Does not apply route once a week. Patient not taking: Reported on 04/06/2024 11/18/23 02/16/24  [name],  FNP  Ostomy Supplies Pouch MISC 1 Application by Does not apply route once a week. Patient not taking: Reported on 04/06/2024 11/18/23 11/17/24  [name], FNP  pantoprazole  (PROTONIX ) 40 MG tablet Take 1 tablet (40 mg total) by mouth daily for 28 days. Patient not taking: Reported on 04/06/2024 12/19/22 12/24/23  [name], PA-C  polyethylene glycol (MIRALAX ) 17 g packet Take 17 g by mouth daily. Patient not taking: Reported on 04/06/2024 02/17/24   [name], PA-C  thiamine  (VITAMIN B-1) 100 MG tablet Take 1 tablet (100 mg total) by mouth daily. Patient not taking: Reported on 04/06/2024 12/30/23   Maczis, Michael M, PA-C    Allergies: Other    Review of Systems  Constitutional:  Negative for chills and fever.  HENT:  Negative for sore throat.   Eyes:  Negative for visual disturbance.  Respiratory:  Negative for cough and shortness of breath.   Cardiovascular:  Negative for chest pain.  Gastrointestinal:  Negative for abdominal pain, diarrhea, nausea and vomiting.  Genitourinary:  Negative for dysuria and frequency.  Musculoskeletal:  Negative for back pain and neck pain.  Skin:  Negative for rash.  Neurological:  Negative for weakness, numbness and headaches.  Hematological:  Negative for adenopathy.  Psychiatric/Behavioral:  Negative for behavioral problems.   All other systems reviewed and are negative.   Updated Vital Signs BP 118/81 (BP Location: Right Arm)   Pulse 76   Temp 97.7 F (36.5 C) (Oral)  Resp 17   Ht 5' 11 (1.803 m)   Wt 80.7 kg   SpO2 100%   BMI 24.81 kg/m   Physical Exam Vitals and nursing note reviewed.  HENT:     Head: Normocephalic and atraumatic.  Eyes:     General: No scleral icterus.    Conjunctiva/sclera: Conjunctivae normal.  Pulmonary:     Effort: Pulmonary effort is normal.  Abdominal:     General: Abdomen is flat. Bowel sounds are normal.     Palpations: Abdomen is soft.     Comments: Normal-appearing ostomy site with bag  intact.  Musculoskeletal:     Cervical back: Normal range of motion and neck supple.  Skin:    Findings: No rash.  Neurological:     Mental Status: He is alert.  Psychiatric:        Mood and Affect: Mood normal.     (all labs ordered are listed, but only abnormal results are displayed) Labs Reviewed - No data to display  EKG: None  Radiology: No results found.   Procedures   Medications Ordered in the ED - No data to display                                  Medical Decision Making  Patient is asymptomatic, and exam of the ostomy site shows a noncomplicated ostomy, well-managed with providing him with new ostomy supplies, provide ostomy bag change, and discharged to outpatient follow-up with him following up with primary care for continued management of his ostomy.  Care plan discussed with patient, he verbalizes understand agree with this with no further concerns at this time thus will discharge with outpatient follow-up.     Final diagnoses:  Ileostomy care [facility])    ED Discharge Orders     None          Myriam Dorn BROCKS, [place] 04/21/24 1615    Randol Simmonds, MD 04/22/24 [phone]  "

## 2024-04-21 NOTE — ED Notes (Signed)
 Pt provided ostomy bag/supply, sandwich, cola, paper pants.

## 2024-04-21 NOTE — ED Triage Notes (Signed)
 Pt is out of ostomy bags and his ostomy was leaking. Needs replacement.

## 2024-04-21 NOTE — ED Provider Notes (Signed)
 " Robert Duffy Arrival date & time: 04/21/24  1742     Patient presents with: Chest Pain   Robert Duffy is a 43 y.o. male.   Patient is a 43 year old male with complaint of chest pain today.  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  He reports he also felt earlier today when he was standing around where there was a lot of vehicle exhaust.  He is not having any shortness of breath or cough.  He reports the pain is not worse with eating.  He does think taking a deep breath makes it a little bit worse.  The history is provided by the patient.  Chest Pain      Prior to Admission medications  Medication Sig Start Date End Date Taking? Authorizing Provider  acetaminophen  (TYLENOL ) 500 MG tablet Take 2 tablets (1,000 mg total) by mouth every 8 (eight) hours as needed. 12/29/23   Maczis, Michael M, PA-C  docusate sodium  (COLACE) 100 MG capsule Take 1 capsule (100 mg total) by mouth 2 (two) times daily. Patient not taking: Reported on 04/06/2024 10/28/23   [name], PA-C  famotidine  (PEPCID ) 20 MG tablet Take 1 tablet (20 mg total) by mouth 2 (two) times daily. Patient not taking: Reported on 04/06/2024 07/18/20   Geiple, Joshua, PA-C  folic acid  (FOLVITE ) 1 MG tablet Take 1 tablet (1 mg total) by mouth daily. Patient not taking: Reported on 04/06/2024 12/30/23   Maczis, Michael M, PA-C  methocarbamol  (ROBAXIN ) 500 MG tablet Take 1 tablet (500 mg total) by mouth every 6 (six) hours as needed (use for muscle cramps/pain). 12/29/23   Maczis, Michael M, PA-C  Multiple Vitamin (MULTIVITAMIN WITH MINERALS) TABS tablet Take 1 tablet by mouth daily. Patient not taking: Reported on 04/06/2024 12/30/23   Maczis, Michael M, PA-C  Ostomy Supplies (OSTOMY BELT LARGE) MISC 1 kit by Does not apply route daily as needed. Patient not  taking: Reported on 04/06/2024 11/18/23 11/17/24  [name], FNP  Ostomy Supplies KIT 1 kit by Does not apply route once a week. Patient not taking: Reported on 04/06/2024 11/18/23 02/16/24  [name], FNP  Ostomy Supplies Pouch MISC 1 Application by Does not apply route once a week. Patient not taking: Reported on 04/06/2024 11/18/23 11/17/24  [name], FNP  pantoprazole  (PROTONIX ) 40 MG tablet Take 1 tablet (40 mg total) by mouth daily for 28 days. Patient not taking: Reported on 04/06/2024 12/19/22 12/24/23  [name], PA-C  polyethylene glycol (MIRALAX ) 17 g packet Take 17 g by mouth daily. Patient not taking: Reported on 04/06/2024 02/17/24   [name], PA-C  thiamine  (VITAMIN B-1) 100 MG tablet Take 1 tablet (100 mg total) by mouth daily. Patient not taking: Reported on 04/06/2024 12/30/23   Maczis, Michael M, PA-C    Allergies: Other    Review of Systems  Cardiovascular:  Positive for chest pain.    Updated Vital Signs BP 124/84 (BP Location: Left Arm)   Pulse 83   Temp 98.2 F (36.8 C) (Oral)   Resp 16   Ht 5' 11 (1.803 m)   Wt 80.7 kg   SpO2 100%   BMI 24.83 kg/m   Physical Exam Vitals and nursing note reviewed.  Constitutional:      General: He is not in acute distress.  Appearance: He is well-developed.  HENT:     Head: Normocephalic and atraumatic.  Eyes:     Conjunctiva/sclera: Conjunctivae normal.     Pupils: Pupils are equal, round, and reactive to light.  Cardiovascular:     Rate and Rhythm: Normal rate and regular rhythm.     Heart sounds: No murmur heard. Pulmonary:     Effort: Pulmonary effort is normal. No respiratory distress.     Breath sounds: Normal breath sounds. No wheezing or rales.  Chest:     Chest wall: No tenderness.  Abdominal:     General: There is no distension.     Palpations: Abdomen is soft.     Tenderness: There is no abdominal tenderness. There is no guarding or rebound.     Comments: Ostomy present with good  output  Musculoskeletal:        General: No tenderness. Normal range of motion.     Cervical back: Normal range of motion and neck supple.  Skin:    General: Skin is warm and dry.     Findings: No erythema or rash.  Neurological:     Mental Status: He is alert and oriented to person, place, and time.  Psychiatric:        Behavior: Behavior normal.     (all labs ordered are listed, but only abnormal results are displayed) Labs Reviewed - No data to display  EKG: None  Radiology: [facility] Chest Port 1 View Result Date: 04/21/2024 EXAM: 1 VIEW(S) XRAY OF THE CHEST 04/21/2024 07:34:27 PM COMPARISON: 02/14/2024 CLINICAL HISTORY: chest pain FINDINGS: LUNGS AND PLEURA: Stable mildly Elevated left hemidiaphragm. Stable calcified left upper lobe granuloma. No pleural effusion. No pneumothorax. HEART AND MEDIASTINUM: No acute abnormality of the cardiac and mediastinal silhouettes. BONES AND SOFT TISSUES: No acute osseous abnormality. IMPRESSION: 1. No acute findings. Electronically signed by: Dorethia Molt MD 04/21/2024 09:30 PM EST RP Workstation: [id number]     Procedures   Medications Ordered in the ED  alum & mag hydroxide-simeth (MAALOX/MYLANTA) 200-200-20 MG/5ML suspension 30 mL (has no administration in time range)                                    Medical Decision Making Amount and/or Complexity of Data Reviewed Radiology: ordered and independent interpretation performed. Decision-making details documented in ED Course.  Risk OTC drugs.   Pt  presenting today with a complaint that caries a high risk for morbidity and mortality.   Patient presenting today with midline chest pain which is nonspecific.  Patient is well-appearing with normal vital signs.  He denies any infectious symptoms.  Suspect most likely musculoskeletal but will do an x-ray to ensure no evidence of pneumothorax.  Lung sounds are otherwise clear.  Low suspicion for acute GI issue. I have independently  visualized and interpreted pt's images today.  Chest x-ray without acute findings.  At this time feel that patient can be discharged home.     Final diagnoses:  Nonspecific chest pain    ED Discharge Orders     None          Doretha Folks, MD 04/21/24 2155  "

## 2024-04-21 NOTE — ED Triage Notes (Signed)
 Pt was leaving the ER today and states he noticed his chest started to hurt, it was hurting earlier but it got worse when he went to leave. When it constricts, I taste blood. Feels like a rod going down

## 2024-05-05 ENCOUNTER — Other Ambulatory Visit: Payer: Self-pay

## 2024-05-05 ENCOUNTER — Emergency Department (HOSPITAL_COMMUNITY)
Admission: EM | Admit: 2024-05-05 | Discharge: 2024-05-05 | Disposition: A | Discharge status: Home / Self Care | Attending: Emergency Medicine | Admitting: Emergency Medicine

## 2024-05-05 DIAGNOSIS — Z433 Encounter for attention to colostomy: Secondary | ICD-10-CM | POA: Insufficient documentation

## 2024-05-05 NOTE — ED Triage Notes (Signed)
 Pt reports her for additional colostomy bag supplies, thought he would have had his reversal by now. Denies any other needs or medical complaints

## 2024-05-05 NOTE — ED Provider Notes (Signed)
 " [place] EMERGENCY DEPARTMENT AT [facility] Provider Note   CSN: [id number] Arrival date & time: 05/05/24  1339     Patient presents with: medical supplies    Robert Duffy is a 44 y.o. 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  Patient stating that he was on expecting to have his colostomy for so long, and ran out of supplies recently.  Patient is otherwise asymptomatic.  Denies fever, abdominal pain, nausea, vomiting, diarrhea.   HPI     Prior to Admission medications  Medication Sig Start Date End Date Taking? Authorizing Provider  acetaminophen  (TYLENOL ) 500 MG tablet Take 2 tablets (1,000 mg total) by mouth every 8 (eight) hours as needed. 12/29/23   Maczis, Michael M, PA-C  docusate sodium  (COLACE) 100 MG capsule Take 1 capsule (100 mg total) by mouth 2 (two) times daily. Patient not taking: Reported on 04/06/2024 10/28/23   [name], PA-C  famotidine  (PEPCID ) 20 MG tablet Take 1 tablet (20 mg total) by mouth 2 (two) times daily. Patient not taking: Reported on 04/06/2024 07/18/20   Geiple, Joshua, PA-C  folic acid  (FOLVITE ) 1 MG tablet Take 1 tablet (1 mg total) by mouth daily. Patient not taking: Reported on 04/06/2024 12/30/23   Maczis, Michael M, PA-C  methocarbamol  (ROBAXIN ) 500 MG tablet Take 1 tablet (500 mg total) by mouth every 6 (six) hours as needed (use for muscle cramps/pain). 12/29/23   Maczis, Michael M, PA-C  Multiple Vitamin (MULTIVITAMIN WITH MINERALS) TABS tablet Take 1 tablet by mouth daily. Patient not taking: Reported on 04/06/2024 12/30/23   Maczis, Michael M, PA-C  Ostomy Supplies (OSTOMY BELT LARGE) MISC 1 kit by Does not apply route daily as needed. Patient not taking: Reported on 04/06/2024 11/18/23 11/17/24  [name], FNP  Ostomy Supplies KIT 1 kit by Does not apply route once a week. Patient not taking: Reported on 04/06/2024 11/18/23 02/16/24  [name], FNP  Ostomy Supplies Pouch  MISC 1 Application by Does not apply route once a week. Patient not taking: Reported on 04/06/2024 11/18/23 11/17/24  [name], FNP  pantoprazole  (PROTONIX ) 40 MG tablet Take 1 tablet (40 mg total) by mouth daily for 28 days. Patient not taking: Reported on 04/06/2024 12/19/22 12/24/23  [name], PA-C  polyethylene glycol (MIRALAX ) 17 g packet Take 17 g by mouth daily. Patient not taking: Reported on 04/06/2024 02/17/24   [name], PA-C  thiamine  (VITAMIN B-1) 100 MG tablet Take 1 tablet (100 mg total) by mouth daily. Patient not taking: Reported on 04/06/2024 12/30/23   Maczis, Michael M, PA-C    Allergies: Other    Review of Systems  Constitutional:        Colostomy supplies    Updated Vital Signs BP 116/79 (BP Location: Right Arm)   Pulse (!) 110   Temp 98.4 F (36.9 C) (Oral)   Resp 18   SpO2 100%   Physical Exam Vitals and nursing note reviewed.  Constitutional:      General: He is not in acute distress.    Appearance: He is not ill-appearing or toxic-appearing.  HENT:     Head: Normocephalic and atraumatic.  Eyes:     General: No scleral icterus.       Right eye: No discharge.        Left eye: No discharge.     Conjunctiva/sclera: Conjunctivae normal.  Cardiovascular:     Rate and Rhythm: Normal rate and  regular rhythm.     Heart sounds: Normal heart sounds.  Pulmonary:     Effort: Pulmonary effort is normal.     Breath sounds: Normal breath sounds.  Abdominal:     General: Abdomen is flat. Bowel sounds are normal. There is no distension.     Palpations: Abdomen is soft. There is no mass.     Tenderness: There is no abdominal tenderness.     Comments: Colostomy site clean, dry, and intact.   Skin:    General: Skin is warm and dry.  Neurological:     General: No focal deficit present.     Mental Status: He is alert and oriented to person, place, and time. Mental status is at baseline.  Psychiatric:        Mood and Affect: Mood normal.         Behavior: Behavior normal.     (all labs ordered are listed, but only abnormal results are displayed) Labs Reviewed - No data to display  EKG: None  Radiology: No results found.   Procedures   Medications Ordered in the ED - No data to display                                  Medical Decision Making  This patient presents to the ED for concern of colostomy supplies, this involves an extensive number of treatment options, and is a complaint that carries with it a high risk of complications and morbidity.  The differential diagnosis includes colostomy supplies, etc.    Co morbidities that complicate the patient evaluation  SBO s/p colostomy   Additional history obtained:  PCP listed in chart    Problem List / ED Course / Critical interventions / Medication management  Patient presents to ED concern for refill of colostomy supplies.  Patient otherwise asymptomatic.  RN was able to provide patient with more colostomy supplies.   Patient mildly tachycardic around 106BPM during my exam. Patient able to hydrate orally. Patient also stating that he used some street drugs yesterday which he thinks is causing his HR to be mildly elevated. Patient continues to be asymptomatic and was able to drink 2 cups of water while in ED room.  Educated patient to follow-up with PCP and general surgeon.  Patient agreeable with plan and is ready to go home. I have reviewed the patients home medicines and have made adjustments as needed The patient has been appropriately medically screened and/or stabilized in the ED. I have low suspicion for any other emergent medical condition which would require further screening, evaluation or treatment in the ED or require inpatient management. At time of discharge the patient is hemodynamically stable and in no acute distress. I have discussed work-up results and diagnosis with patient and answered all questions. Patient is agreeable with discharge plan. We  discussed strict return precautions for returning to the emergency department and they verbalized understanding.     Social Determinants of Health:  none      Final diagnoses:  Colostomy care [facility])    ED Discharge Orders     None          Hoy Nidia FALCON, [place] 05/05/24 1506    Ellouise Richerd POUR, [place] 05/05/24 1508  "

## 2024-05-05 NOTE — Discharge Instructions (Signed)
 With your general surgeon and primary care provider.  Seek emergency care if experiencing any new or worsening symptoms.

## 2024-05-10 ENCOUNTER — Other Ambulatory Visit (HOSPITAL_COMMUNITY): Payer: Self-pay

## 2024-05-15 ENCOUNTER — Other Ambulatory Visit (HOSPITAL_COMMUNITY): Payer: Self-pay

## 2024-05-23 ENCOUNTER — Encounter (HOSPITAL_COMMUNITY): Payer: Self-pay

## 2024-05-23 ENCOUNTER — Other Ambulatory Visit: Payer: Self-pay

## 2024-05-23 ENCOUNTER — Emergency Department (HOSPITAL_COMMUNITY)

## 2024-05-23 ENCOUNTER — Emergency Department (HOSPITAL_COMMUNITY)
Admission: EM | Admit: 2024-05-23 | Discharge: 2024-05-24 | Disposition: A | Attending: Emergency Medicine | Admitting: Emergency Medicine

## 2024-05-23 DIAGNOSIS — Z433 Encounter for attention to colostomy: Secondary | ICD-10-CM | POA: Insufficient documentation

## 2024-05-23 DIAGNOSIS — R0789 Other chest pain: Secondary | ICD-10-CM | POA: Insufficient documentation

## 2024-05-23 NOTE — ED Notes (Signed)
 PT body is cold and is unable to record 02 and pulse at this time. Tech is aware

## 2024-05-23 NOTE — ED Triage Notes (Signed)
 Pt states that he needs colostomy supplies and states that his chest has spasms intermittently from an old injury. Pt has been walking in the cold today.

## 2024-05-24 NOTE — ED Notes (Signed)
 Pt was provided with a few colostomy supplies

## 2024-05-24 NOTE — ED Provider Notes (Signed)
 " [place] EMERGENCY DEPARTMENT AT [facility] Provider Note  CSN: [id number] Arrival date & time: 05/23/24 2245  Chief Complaint(s) colostomy supplies  History provided by patient. HPI & MDM Robert Duffy is a 44 y.o. male .  HPI Ostomy supplies Here requesting ostomy supplies.  Having nonbloody stools.  No abdominal complaints. Chest cramps On the way to the emergency department while walking in the cold, patient complained of sternal chest discomfort.  It is nonradiating. Patient reports this is similar to prior chest pain described as cramps.  No shortness of breath.  Patient states that pain is completely subsided since arriving to the emergency department.  He denies any falls or trauma.  No cough or congestion.   Medical Decision Making Amount and/or Complexity of Data Reviewed Radiology: ordered and independent interpretation performed. Decision-making details documented in ED Course. ECG/medicine tests: ordered and independent interpretation performed. Decision-making details documented in ED Course.    Ostomy supplies Ostomy site is reassuring.  No abdominal tenderness.  Nonbloody stool. Supplies provided Chest cramps Similar to prior episodes.  EKG without acute ischemic changes, dysrhythmias or blocks.  No evidence of pericarditis.  Presentation not concerning for ACS. Chest x-ray negative for pneumonia, pneumothorax, pulmonary edema pleural effusions. Presentation not suspicious for aortic dissection or esophageal perforation.  Low suspicion for pulmonary embolism.  Final Clinical Impression(s) / ED Diagnoses Final diagnoses:  Chest discomfort  Colostomy care [facility])   The patient appears reasonably screened and/or stabilized for discharge and I doubt any other medical condition or other [facility] requiring further screening, evaluation, or treatment in the ED at this time. I have discussed the findings, Dx and Tx plan with the patient/family who expressed  understanding and agree(s) with the plan. Discharge instructions discussed at length. The patient/family was given strict return precautions who verbalized understanding of the instructions. No further questions at time of discharge.  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 Authorizing Provider  acetaminophen  (TYLENOL ) 500 MG tablet Take 2 tablets (1,000 mg total) by mouth every 8 (eight) hours as needed. 12/29/23   Maczis, Michael M, PA-C  docusate sodium  (COLACE) 100 MG capsule Take 1 capsule (100 mg total) by mouth 2 (two) times daily. Patient not taking: Reported on 04/06/2024 10/28/23   [name], PA-C  famotidine  (PEPCID ) 20 MG tablet Take 1 tablet (20 mg total) by mouth 2 (two) times daily. Patient not taking: Reported on 04/06/2024 07/18/20   Geiple, Joshua, PA-C  folic acid  (FOLVITE ) 1 MG tablet Take 1 tablet (1 mg total) by mouth daily. Patient not taking: Reported on 04/06/2024 12/30/23   Maczis, Michael M, PA-C  methocarbamol  (ROBAXIN ) 500 MG tablet Take 1 tablet (500 mg total) by mouth every 6 (six) hours as needed (use for muscle  cramps/pain). 12/29/23   Maczis, Michael M, PA-C  Multiple Vitamin (MULTIVITAMIN WITH MINERALS) TABS tablet Take 1 tablet by mouth daily. Patient not  taking: Reported on 04/06/2024 12/30/23   Maczis, Michael M, PA-C  Ostomy Supplies (OSTOMY BELT LARGE) MISC 1 kit by Does not apply route daily as needed. Patient not taking: Reported on 04/06/2024 11/18/23 11/17/24  [name], FNP  Ostomy Supplies KIT 1 kit by Does not apply route once a week. Patient not taking: Reported on 04/06/2024 11/18/23 02/16/24  [name], FNP  Ostomy Supplies Pouch MISC 1 Application by Does not apply route once a week. Patient not taking: Reported on 04/06/2024 11/18/23 11/17/24  [name], FNP  pantoprazole  (PROTONIX ) 40 MG tablet Take 1 tablet (40 mg total) by mouth daily for 28 days. Patient not taking: Reported on 04/06/2024 12/19/22 12/24/23  [name], PA-C  polyethylene glycol (MIRALAX ) 17 g packet Take 17 g by mouth daily. Patient not taking: Reported on 04/06/2024 02/17/24   [name], PA-C  thiamine  (VITAMIN B-1) 100 MG tablet Take 1 tablet (100 mg total) by mouth daily. Patient not taking: Reported on 04/06/2024 12/30/23   Maczis, Michael M, PA-C                                                                                                                                    Allergies Other  Review of Systems Review of Systems As noted in HPI  Physical Exam Vital Signs  I have reviewed the triage vital signs BP (!) 130/93   Temp 97.6 F (36.4 C) (Oral)   Resp 17   Physical Exam Vitals reviewed.  Constitutional:      General: He is not in acute distress.    Appearance: He is well-developed. He is not diaphoretic.  HENT:     Head: Normocephalic and atraumatic.     Right Ear: External ear normal.     Left Ear: External ear normal.     Nose: Nose normal.     Mouth/Throat:     Mouth: Mucous membranes are moist.  Eyes:     General: No scleral icterus.       Right eye: No discharge.        Left eye: No discharge.     Conjunctiva/sclera: Conjunctivae normal.     Pupils: Pupils are equal, round, and reactive to light.  Neck:      Trachea: Phonation normal.  Cardiovascular:     Rate and Rhythm: Normal rate and regular rhythm.     Heart sounds: No murmur heard.    No friction rub. No gallop.  Pulmonary:     Effort: Pulmonary effort is normal. No respiratory distress.     Breath sounds: Normal breath sounds. No stridor. No rales.  Abdominal:     General: A surgical scar is present. There is no distension.     Palpations: Abdomen is soft.  Tenderness: There is no abdominal tenderness.   Musculoskeletal:        General: No tenderness. Normal range of motion.     Cervical back: Normal range of motion and neck supple.  Skin:    General: Skin is warm and dry.     Findings: No erythema or rash.  Neurological:     Mental Status: He is alert and oriented to person, place, and time.  Psychiatric:        Behavior: Behavior normal.     ED Results and Treatments Labs (all labs ordered are listed, but only abnormal results are displayed) Labs Reviewed - No data to display                                                                                                                       EKG  EKG Interpretation Date/Time:  Wednesday May 24 2024 00:14:33 EST Ventricular Rate:  78 PR Interval:  143 QRS Duration:  94 QT Interval:  421 QTC Calculation: 480 R Axis:   42  Text Interpretation: Sinus rhythm Probable left atrial enlargement Probable left ventricular hypertrophy Borderline prolonged QT interval Otherwise no significant change Confirmed by Trine Likes [phone]) on 05/24/2024 12:20:53 AM       Radiology DG Chest Port 1 View Result Date: 05/24/2024 EXAM: 1 VIEW(S) XRAY OF THE CHEST 05/23/2024 11:59:00 PM COMPARISON: 04/21/2024 CLINICAL HISTORY: Chest discomfort. FINDINGS: LUNGS AND PLEURA: Persistent elevation of left hemidiaphragm. No focal pulmonary opacity. No pleural effusion. No pneumothorax. HEART AND MEDIASTINUM: No acute abnormality of the cardiac and mediastinal silhouettes. BONES AND SOFT  TISSUES: No acute osseous abnormality. IMPRESSION: 1. No acute findings. 2. Persistent elevation of left hemidiaphragm. Electronically signed by: Dorethia Molt MD 05/24/2024 12:02 AM EST RP Workstation: [id number]    Medications Ordered in ED Medications - No data to display Procedures Procedures  (including critical care time)   This chart was dictated using voice recognition software.  Despite best efforts to proofread,  errors can occur which can change the documentation meaning.   Trine Likes Moder, MD 05/24/24 [phone]  "

## 2024-05-28 ENCOUNTER — Encounter (HOSPITAL_COMMUNITY): Payer: Self-pay

## 2024-05-28 ENCOUNTER — Emergency Department (HOSPITAL_COMMUNITY)
Admission: EM | Admit: 2024-05-28 | Discharge: 2024-05-28 | Disposition: A | Discharge status: Home / Self Care | Attending: Emergency Medicine | Admitting: Emergency Medicine

## 2024-05-28 ENCOUNTER — Other Ambulatory Visit: Payer: Self-pay

## 2024-05-28 DIAGNOSIS — Z433 Encounter for attention to colostomy: Secondary | ICD-10-CM | POA: Diagnosis not present

## 2024-05-28 DIAGNOSIS — J069 Acute upper respiratory infection, unspecified: Secondary | ICD-10-CM | POA: Insufficient documentation

## 2024-05-28 DIAGNOSIS — R059 Cough, unspecified: Secondary | ICD-10-CM | POA: Diagnosis present

## 2024-05-28 NOTE — ED Triage Notes (Signed)
 Patient said he needs some colostomy bag supplies. He also said he wants medication for his dry cough and has yellow mucus in his nose.

## 2024-05-28 NOTE — Discharge Instructions (Signed)
 Return if any problems.

## 2024-05-28 NOTE — ED Provider Notes (Signed)
 " [place] EMERGENCY DEPARTMENT AT [facility] Provider Note   CSN: [id number] Arrival date & time: 05/28/24  1441     Patient presents with: Need Colostomy Bag Supplies   Robert Duffy is a 44 y.o. male.   Patient complains of a cough and congestion.  Patient states symptoms began on Saturday.  He has not had a fever or chills.  Patient also reports that he has ran out of his colostomy supplies.  Patient is requesting a colostomy bag.  Patient denies any other complaints he has not had any fever or chills he denies any shortness of breath he is not having any abdominal pain  The history is provided by the patient. No language interpreter was used.       Prior to Admission medications  Medication Sig Start Date End Date Taking? Authorizing Provider  acetaminophen  (TYLENOL ) 500 MG tablet Take 2 tablets (1,000 mg total) by mouth every 8 (eight) hours as needed. 12/29/23   Maczis, Michael M, PA-C  docusate sodium  (COLACE) 100 MG capsule Take 1 capsule (100 mg total) by mouth 2 (two) times daily. Patient not taking: Reported on 04/06/2024 10/28/23   [name], PA-C  famotidine  (PEPCID ) 20 MG tablet Take 1 tablet (20 mg total) by mouth 2 (two) times daily. Patient not taking: Reported on 04/06/2024 07/18/20   Geiple, Joshua, PA-C  folic acid  (FOLVITE ) 1 MG tablet Take 1 tablet (1 mg total) by mouth daily. Patient not taking: Reported on 04/06/2024 12/30/23   Maczis, Michael M, PA-C  methocarbamol  (ROBAXIN ) 500 MG tablet Take 1 tablet (500 mg total) by mouth every 6 (six) hours as needed (use for muscle cramps/pain). 12/29/23   Maczis, Michael M, PA-C  Multiple Vitamin (MULTIVITAMIN WITH MINERALS) TABS tablet Take 1 tablet by mouth daily. Patient not taking: Reported on 04/06/2024 12/30/23   Maczis, Michael M, PA-C  Ostomy Supplies (OSTOMY BELT LARGE) MISC 1 kit by Does not apply route daily as needed. Patient not taking: Reported on 04/06/2024 11/18/23 11/17/24  [name], FNP   Ostomy Supplies KIT 1 kit by Does not apply route once a week. Patient not taking: Reported on 04/06/2024 11/18/23 02/16/24  [name], FNP  Ostomy Supplies Pouch MISC 1 Application by Does not apply route once a week. Patient not taking: Reported on 04/06/2024 11/18/23 11/17/24  [name], FNP  pantoprazole  (PROTONIX ) 40 MG tablet Take 1 tablet (40 mg total) by mouth daily for 28 days. Patient not taking: Reported on 04/06/2024 12/19/22 12/24/23  Franaszek, Amanda, PA-C  polyethylene glycol (MIRALAX ) 17 g packet Take 17 g by mouth daily. Patient not taking: Reported on 04/06/2024 02/17/24   [name], PA-C  thiamine  (VITAMIN B-1) 100 MG tablet Take 1 tablet (100 mg total) by mouth daily. Patient not taking: Reported on 04/06/2024 12/30/23   Maczis, Michael M, PA-C    Allergies: Other    Review of Systems  All other systems reviewed and are negative.   Updated Vital Signs BP 126/86 (BP Location: Right Arm)   Pulse (!) 102   Temp 98.6 F (37 C) (Oral)   Resp 16   Ht 5' 11 (1.803 m)   Wt 80 kg   SpO2 98%   BMI 24.60 kg/m   Physical Exam Vitals and nursing note reviewed.  Constitutional:      Appearance: He is well-developed.  HENT:     Head: Normocephalic.  Cardiovascular:     Rate and Rhythm: Normal rate.  Pulmonary:  Effort: Pulmonary effort is normal.  Abdominal:     General: There is no distension.  Musculoskeletal:        General: Normal range of motion.     Cervical back: Normal range of motion.  Skin:    General: Skin is warm.  Neurological:     General: No focal deficit present.     Mental Status: He is alert and oriented to person, place, and time.     (all labs ordered are listed, but only abnormal results are displayed) Labs Reviewed - No data to display  EKG: None  Radiology: No results found.   Procedures   Medications Ordered in the ED - No data to display                                  Medical Decision Making Patient  complains of a cough and congestion.  Patient also request to colostomy bags as he has ran out of bags  Amount and/or Complexity of Data Reviewed Discussion of management or test interpretation with external provider(s): Patient has normal vital signs.  Oxygen saturation is 98% his lungs are clear he is not coughing.  Patient is counseled on probable viral respiratory infection he is advised he will need symptomatic treatment Patient is given colostomy supplies to go.        Final diagnoses:  Upper respiratory tract infection, unspecified type  Attention to colostomy [facility])    ED Discharge Orders     None       An After Visit Summary was printed and given to the patient.    Flint Sonny MARLA DEVONNA 05/28/24 1748    Garrick Charleston, MD 05/28/24 TRENNA  "

## 2024-06-29 ENCOUNTER — Inpatient Hospital Stay (HOSPITAL_COMMUNITY): Admit: 2024-06-29 | Admitting: Surgery
